# Patient Record
Sex: Female | Born: 1975 | Race: White | Hispanic: No | Smoking: Current every day smoker
Health system: Southern US, Community
[De-identification: ages and names within clinical notes are randomized; demographics above are authoritative.]

## PROBLEM LIST (undated history)

## (undated) DIAGNOSIS — R112 Nausea with vomiting, unspecified: Secondary | ICD-10-CM

## (undated) DIAGNOSIS — J45909 Unspecified asthma, uncomplicated: Secondary | ICD-10-CM

## (undated) DIAGNOSIS — F988 Other specified behavioral and emotional disorders with onset usually occurring in childhood and adolescence: Secondary | ICD-10-CM

## (undated) DIAGNOSIS — F32A Depression, unspecified: Secondary | ICD-10-CM

## (undated) DIAGNOSIS — F329 Major depressive disorder, single episode, unspecified: Secondary | ICD-10-CM

## (undated) DIAGNOSIS — Z9889 Other specified postprocedural states: Secondary | ICD-10-CM

## (undated) DIAGNOSIS — N63 Unspecified lump in unspecified breast: Secondary | ICD-10-CM

## (undated) DIAGNOSIS — R51 Headache: Secondary | ICD-10-CM

## (undated) HISTORY — PX: TONSILLECTOMY AND ADENOIDECTOMY: SHX28

## (undated) HISTORY — PX: LEEP: SHX91

## (undated) HISTORY — PX: CYST EXCISION: SHX5701

## (undated) HISTORY — PX: ENDOMETRIAL ABLATION: SHX621

## (undated) HISTORY — PX: TUBAL LIGATION: SHX77

## (undated) HISTORY — DX: Unspecified asthma, uncomplicated: J45.909

---

## 2011-06-10 ENCOUNTER — Emergency Department
Admission: EM | Admit: 2011-06-10 | Discharge: 2011-06-10 | Disposition: A | Discharge status: Home / Self Care | Payer: 59 | Source: Home / Self Care | Attending: Family Medicine | Admitting: Family Medicine

## 2011-06-10 ENCOUNTER — Encounter: Payer: Self-pay | Admitting: Emergency Medicine

## 2011-06-10 DIAGNOSIS — J0100 Acute maxillary sinusitis, unspecified: Secondary | ICD-10-CM

## 2011-06-10 DIAGNOSIS — J069 Acute upper respiratory infection, unspecified: Secondary | ICD-10-CM

## 2011-06-10 DIAGNOSIS — J45909 Unspecified asthma, uncomplicated: Secondary | ICD-10-CM

## 2011-06-10 DIAGNOSIS — J4540 Moderate persistent asthma, uncomplicated: Secondary | ICD-10-CM

## 2011-06-10 MED ORDER — AMOXICILLIN 875 MG PO TABS: 875.0000 mg | ORAL_TABLET | Freq: Two times a day (BID) | ORAL | Status: AC

## 2011-06-10 MED ORDER — BUDESONIDE 90 MCG/ACT IN AEPB: 2.0000 | INHALATION_SPRAY | Freq: Two times a day (BID) | RESPIRATORY_TRACT | Status: DC

## 2011-06-10 MED ORDER — GUAIFENESIN-CODEINE 100-10 MG/5ML PO SYRP: 10.0000 mL | ORAL_SOLUTION | Freq: Every day | ORAL | Status: AC

## 2011-06-10 MED ORDER — PREDNISONE 20 MG PO TABS: ORAL_TABLET | ORAL | Status: DC

## 2011-06-10 NOTE — Discharge Instructions (Signed)
Take Mucinex D (guaifenesin with decongestant) twice daily for congestion.  Increase fluid intake, rest. May use Afrin nasal spray (or generic oxymetazoline) twice daily for about 5 days.  Also recommend using saline nasal spray several times daily and saline nasal irrigation (AYR is a common brand) Continue albuterol inhaler as needed. Stop all antihistamines for now, and other non-prescription cough/cold preparations. Follow-up with family doctor in about one month to discuss an asthma plan of treatment

## 2011-06-10 NOTE — ED Provider Notes (Signed)
History     CSN: [id number]  Arrival date & time 06/10/11  1949   First MD Initiated Contact with Patient 06/10/11 2002      Chief Complaint  Patient presents with  . Nasal Congestion      HPI Comments: Patient complains of approximately 7 day history of gradually progressive URI symptoms beginning with a mild sore throat (now improved), followed by progressive nasal congestion.  A cough started about 5 days ago.  Complains of fatigue and initial myalgias.  Cough is now worse at night and generally non-productive during the day.  There has been no pleuritic pain but she has developed persistent wheezing and shortness of breath with activity.  Last night she noted a fever of 101. She has asthma and normally uses her albuterol inhaler about 3 times/week.  Over the past several days she has had to use it multiple times daily.  When well she does not generally have night cough.  She had a respiratory infection requiring prednisone last month.  She is not presently on a controller medication although she has used Advair in the past.  She also does not have an asthma treatment plan.  She has a history of seasonal allergies, and she has had pneumonia in the past, about 11 years ago.   The history is provided by the patient.    Past Medical History  Diagnosis Date  . Asthma     History reviewed. No pertinent past surgical history.  No pertinent family history.  History  Substance Use Topics  . Smoking status: Never Smoker   . Smokeless tobacco: Not on file  . Alcohol Use: Yes    OB History    Grav Para Term Preterm Abortions TAB SAB Ect Mult Living                  Review of Systems + sore throat + cough No pleuritic pain + wheezing + nasal congestion + post-nasal drainage + sinus pain/pressure No itchy/red eyes ? Earache; right ear feels clogged for 3 days No hemoptysis + SOB with activity + fever/chills to 101 last night No nausea No vomiting No abdominal pain No  diarrhea No urinary symptoms No skin rashes + fatigue + myalgias No headache Used OTC meds without relief  Allergies  Vicodin  Home Medications   Current Outpatient Rx  Name Route Sig Dispense Refill  . ALBUTEROL SULFATE (2.5 MG/3ML) 0.083% IN NEBU Nebulization Take 2.5 mg by nebulization every 6 (six) hours as needed.    . BUPROPION HCL 100 MG PO TABS Oral Take 100 mg by mouth 2 (two) times daily.    Marland Kitchen ESCITALOPRAM OXALATE 10 MG PO TABS Oral Take 10 mg by mouth daily.    . AMOXICILLIN 875 MG PO TABS Oral Take 1 tablet (875 mg total) by mouth 2 (two) times daily. 20 tablet 0  . BUDESONIDE 90 MCG/ACT IN AEPB Inhalation Inhale 2 puffs into the lungs 2 (two) times daily. 1 Inhaler 1  . GUAIFENESIN-CODEINE 100-10 MG/5ML PO SYRP Oral Take 10 mLs by mouth at bedtime. for cough 120 mL 0  . PREDNISONE 20 MG PO TABS  Take one tab by mouth twice daily for 5 days 10 tablet 0    BP 140/87  Pulse 79  Temp(Src) 98.9 F (37.2 C) (Oral)  Resp 18  Ht 5\' 6"  (1.676 m)  Wt 177 lb (80.287 kg)  BMI 28.57 kg/m2  SpO2 93%  Physical Exam Nursing notes and Vital Signs  reviewed. Appearance:  Patient appears healthy, stated age, and in no acute distress Eyes:  Pupils are equal, round, and reactive to light and accomodation.  Extraocular movement is intact.  Conjunctivae are not inflamed  Ears:  Canals normal.  Tympanic membranes normal.  Nose:  Moderately congested turbinates.  Maxillary sinus tenderness is present.  Pharynx:  Normal Neck:  Supple.  Slightly tender shotty posterior nodes are palpated bilaterally  Lungs:  Bibasilar posterior wheezes are present.  Breath sounds are equal.  No respiratory distress Chest:  Mild tenderness to palpation over the mid-sternum.  Heart:  Regular rate and rhythm without murmurs, rubs, or gallops.  Abdomen:  Nontender without masses or hepatosplenomegaly.  Bowel sounds are present.  No CVA or flank tenderness.  Extremities:  No edema.  No calf tenderness Skin:   No rash present.   ED Course  Procedures  none      1. Acute upper respiratory infections of unspecified site   2. Moderate persistent asthma; not adequately controlled, exacerbated by present URI  3. Acute maxillary sinusitis       MDM   Begin amoxicillin for 10 days.  Begin prednisone burst.  Begin controller medication with Pulmicort.  Rx for cough suppressant at bedtime (patient notes that she has no adverse reactions to codeine). Take Mucinex D (guaifenesin with decongestant) twice daily for congestion.  Increase fluid intake, rest. May use Afrin nasal spray (or generic oxymetazoline) twice daily for about 5 days.  Also recommend using saline nasal spray several times daily and saline nasal irrigation (AYR is a common brand) Continue albuterol inhaler as needed. Stop all antihistamines for now, and other non-prescription cough/cold preparations. Followup with Family Doctor if not improved in one week.  Return for worsening symptoms. Continue the Pulmicort and follow-up with family doctor in about one month to discuss an asthma plan of treatment         Lattie Haw, MD 06/11/11 [phone]

## 2011-06-10 NOTE — ED Notes (Signed)
Congestion, thick yellow mucus, wheezing x 1 week

## 2012-01-06 ENCOUNTER — Ambulatory Visit (INDEPENDENT_AMBULATORY_CARE_PROVIDER_SITE_OTHER): Payer: 59

## 2012-01-06 ENCOUNTER — Ambulatory Visit (INDEPENDENT_AMBULATORY_CARE_PROVIDER_SITE_OTHER): Payer: 59 | Admitting: Family Medicine

## 2012-01-06 ENCOUNTER — Encounter: Payer: Self-pay | Admitting: Family Medicine

## 2012-01-06 VITALS — BP 148/101 | HR 92 | Temp 97.8°F | Wt 168.0 lb

## 2012-01-06 DIAGNOSIS — R062 Wheezing: Secondary | ICD-10-CM

## 2012-01-06 DIAGNOSIS — R0602 Shortness of breath: Secondary | ICD-10-CM

## 2012-01-06 DIAGNOSIS — R05 Cough: Secondary | ICD-10-CM

## 2012-01-06 DIAGNOSIS — J45901 Unspecified asthma with (acute) exacerbation: Secondary | ICD-10-CM

## 2012-01-06 MED ORDER — IPRATROPIUM BROMIDE 0.02 % IN SOLN
0.5000 mg | Freq: Once | RESPIRATORY_TRACT | Status: AC
  Administered 2012-01-06: 0.5 mg via RESPIRATORY_TRACT

## 2012-01-06 MED ORDER — SODIUM CHLORIDE 0.9 % IV SOLN: 40.0000 mg | Freq: Once | INTRAVENOUS | Status: DC

## 2012-01-06 MED ORDER — METHYLPREDNISOLONE SODIUM SUCC 40 MG IJ SOLR: 80.0000 mg | Freq: Once | INTRAMUSCULAR | Status: DC

## 2012-01-06 MED ORDER — ALBUTEROL SULFATE (2.5 MG/3ML) 0.083% IN NEBU
2.5000 mg | INHALATION_SOLUTION | Freq: Once | RESPIRATORY_TRACT | Status: AC
  Administered 2012-01-06: 2.5 mg via RESPIRATORY_TRACT

## 2012-01-06 MED ORDER — PREDNISONE 20 MG PO TABS: ORAL_TABLET | ORAL | Status: AC

## 2012-01-06 MED ORDER — DOXYCYCLINE HYCLATE 100 MG PO TABS: ORAL_TABLET | ORAL | Status: AC

## 2012-01-06 NOTE — Patient Instructions (Signed)
Start prednisone and doxycycline as soon as possible. Use the albuterol inhaler 2- 4 puffs every 4 hours for the next 48 hours and then every 6 hours only as needed. Please return sometime next week for reevaluation

## 2012-01-06 NOTE — Progress Notes (Signed)
CC: Janet Harrison is a 36 y.o. female is here for Establish Care and Respiratory Distress   Subjective: HPI:  Very pleasant 36 year old presents to establish care.  Over the past 48 hours she has had worsening shortness of breath, persistent cough, and tightness of the chest. Symptoms seem to be worse in the evenings and when taking showers. Her cough is nonproductive, has not had any blood in her sputum. She describes the above symptoms as moderate to severe in severity. Symptoms i improved drastically to albuterol that she has at home. Nothing seems to make better or worse otherwise. She doesn't she has a history of asthma as a child but is not on any controller medication, she rarely uses her albuterol other than this recent duration. She denies fevers, chills, confusion, headaches, motor sensory disturbances, back pain, abdominal pain, nausea, fatigue, rashes, joint or muscle pain, unilateral limb swelling, nor history of blood clots.   Review Of Systems Outlined In HPI  Past Medical History  Diagnosis Date  . Asthma   . Hypertension      Family History  Problem Relation Age of Onset  . Breast cancer      colon cancer     History  Substance Use Topics  . Smoking status: Never Smoker   . Smokeless tobacco: Not on file  . Alcohol Use: Yes     Objective: Filed Vitals:   01/06/12 0934  BP: 148/101  Pulse: 92  Temp: 97.8 F (36.6 C)    General: Alert and Oriented, No Acute Distress HEENT: Pupils equal, round, reactive to light. Conjunctivae clear.  External ears unremarkable, canals clear with intact TMs with appropriate landmarks.  Middle ear appears open without effusion. Pink inferior turbinates.  Moist mucous membranes, pharynx without inflammation nor lesions.  Neck supple without palpable lymphadenopathy nor abnormal masses. Lungs: Moderate diffuse end expiratory wheezing with mild diffuse rhonchi, no signs of consolidation, frequent repetitive coughing Cardiac: Regular  rate and rhythm. Normal S1/S2.  No murmurs, rubs, nor gallops.   Abdomen: Soft nontender to palpation Extremities: No peripheral edema.  Strong peripheral pulses.  Mental Status: No depression, anxiety, nor agitation. Skin: Warm and dry.  Assessment & Plan: Hosanna was seen today for establish care and respiratory distress.  Diagnoses and associated orders for this visit:  Cough - DG Chest 2 View; Future - methylPREDNISolone sodium succinate (SOLU-MEDROL) 40 mg in sodium chloride 0.9 % 50 mL IVPB; Inject 40 mg into the muscle once. - methylPREDNISolone sodium succinate (SOLU-MEDROL) 40 mg in sodium chloride 0.9 % 50 mL IVPB; Inject 40 mg into the vein once.  Shortness of breath - DG Chest 2 View; Future  Asthma exacerbation - predniSONE (DELTASONE) 20 MG tablet; Three tabs daily days 1-3, two tabs daily days 4-6, one tab daily days 7-9, half tab daily days 10-13. - doxycycline (VIBRA-TABS) 100 MG tablet; One by mouth twice a day for ten days. - albuterol (PROVENTIL) (2.5 MG/3ML) 0.083% nebulizer solution 2.5 mg; Take 3 mLs (2.5 mg total) by nebulization once. - ipratropium (ATROVENT) nebulizer solution 0.5 mg; Take 2.5 mLs (0.5 mg total) by nebulization once.  Walking pulse oximetry did not fall, chest x-ray was obtained personally interpreted by myself showing bronchial wall thickening, hyperinflation, no signs of consolidation nor pulmonary edema. She received DuoNeb nebulizer which greatly improved her cough frequency, and she sounded much more clear on lung exam. Shot of Solu-Medrol given, she'll start prednisone taper, and doxycycline for any bacterial coverage. I've asked her to return  next week for reevaluation and discuss management of asthma.    Return in about 1 week (around 01/13/2012).

## 2012-01-11 ENCOUNTER — Encounter: Payer: Self-pay | Admitting: Family Medicine

## 2012-01-11 ENCOUNTER — Ambulatory Visit (INDEPENDENT_AMBULATORY_CARE_PROVIDER_SITE_OTHER): Payer: 59 | Admitting: Family Medicine

## 2012-01-11 VITALS — BP 140/93 | HR 84 | Wt 169.0 lb

## 2012-01-11 DIAGNOSIS — J3089 Other allergic rhinitis: Secondary | ICD-10-CM

## 2012-01-11 DIAGNOSIS — Z87410 Personal history of cervical dysplasia: Secondary | ICD-10-CM | POA: Insufficient documentation

## 2012-01-11 DIAGNOSIS — F419 Anxiety disorder, unspecified: Secondary | ICD-10-CM | POA: Insufficient documentation

## 2012-01-11 DIAGNOSIS — F329 Major depressive disorder, single episode, unspecified: Secondary | ICD-10-CM

## 2012-01-11 DIAGNOSIS — IMO0002 Reserved for concepts with insufficient information to code with codable children: Secondary | ICD-10-CM | POA: Insufficient documentation

## 2012-01-11 DIAGNOSIS — J45909 Unspecified asthma, uncomplicated: Secondary | ICD-10-CM

## 2012-01-11 DIAGNOSIS — F411 Generalized anxiety disorder: Secondary | ICD-10-CM

## 2012-01-11 DIAGNOSIS — J3081 Allergic rhinitis due to animal (cat) (dog) hair and dander: Secondary | ICD-10-CM

## 2012-01-11 MED ORDER — ESCITALOPRAM OXALATE 20 MG PO TABS: 10.0000 mg | ORAL_TABLET | Freq: Every day | ORAL | Status: DC

## 2012-01-11 MED ORDER — BUDESONIDE 90 MCG/ACT IN AEPB: 2.0000 | INHALATION_SPRAY | Freq: Two times a day (BID) | RESPIRATORY_TRACT | Status: DC

## 2012-01-11 MED ORDER — BUPROPION HCL ER (XL) 300 MG PO TB24: 300.0000 mg | ORAL_TABLET | Freq: Every day | ORAL | Status: DC

## 2012-01-11 MED ORDER — ESCITALOPRAM OXALATE 20 MG PO TABS: 20.0000 mg | ORAL_TABLET | Freq: Every day | ORAL | Status: DC
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
## 2012-02-10 ENCOUNTER — Other Ambulatory Visit: Payer: Self-pay | Admitting: *Deleted

## 2012-02-10 MED ORDER — ALBUTEROL SULFATE (2.5 MG/3ML) 0.083% IN NEBU: 2.5000 mg | INHALATION_SOLUTION | Freq: Four times a day (QID) | RESPIRATORY_TRACT | Status: DC | PRN

## 2012-02-11 ENCOUNTER — Encounter: Payer: 59 | Admitting: Family Medicine

## 2012-02-12 ENCOUNTER — Encounter: Payer: 59 | Admitting: Family Medicine

## 2012-02-29 ENCOUNTER — Encounter: Payer: 59 | Admitting: Family Medicine

## 2012-03-08 ENCOUNTER — Ambulatory Visit (INDEPENDENT_AMBULATORY_CARE_PROVIDER_SITE_OTHER): Payer: 59 | Admitting: Family Medicine

## 2012-03-08 ENCOUNTER — Other Ambulatory Visit (HOSPITAL_COMMUNITY)
Admission: RE | Admit: 2012-03-08 | Discharge: 2012-03-08 | Disposition: A | Discharge status: Home / Self Care | Payer: 59 | Source: Ambulatory Visit | Attending: Family Medicine | Admitting: Family Medicine

## 2012-03-08 ENCOUNTER — Encounter: Payer: Self-pay | Admitting: Family Medicine

## 2012-03-08 VITALS — BP 133/84 | HR 79 | Wt 170.0 lb

## 2012-03-08 DIAGNOSIS — Z131 Encounter for screening for diabetes mellitus: Secondary | ICD-10-CM

## 2012-03-08 DIAGNOSIS — Z87410 Personal history of cervical dysplasia: Secondary | ICD-10-CM

## 2012-03-08 DIAGNOSIS — Z23 Encounter for immunization: Secondary | ICD-10-CM

## 2012-03-08 DIAGNOSIS — Z1151 Encounter for screening for human papillomavirus (HPV): Secondary | ICD-10-CM | POA: Insufficient documentation

## 2012-03-08 DIAGNOSIS — Z13220 Encounter for screening for lipoid disorders: Secondary | ICD-10-CM

## 2012-03-08 DIAGNOSIS — Z01419 Encounter for gynecological examination (general) (routine) without abnormal findings: Secondary | ICD-10-CM | POA: Insufficient documentation

## 2012-03-08 DIAGNOSIS — J45909 Unspecified asthma, uncomplicated: Secondary | ICD-10-CM

## 2012-03-08 DIAGNOSIS — Z124 Encounter for screening for malignant neoplasm of cervix: Secondary | ICD-10-CM

## 2012-03-08 DIAGNOSIS — Z0000 Encounter for general adult medical examination without abnormal findings: Secondary | ICD-10-CM

## 2012-03-08 LAB — BASIC METABOLIC PANEL WITH GFR
CO2: 22 mEq/L (ref 19–32)
Calcium: 8.7 mg/dL (ref 8.4–10.5)
GFR, Est African American: >89 mL/min
Potassium: 4.1 mEq/L (ref 3.5–5.3)
Sodium: 140 mEq/L (ref 135–145)

## 2012-03-08 LAB — LIPID PANEL
HDL: 46 mg/dL (ref >39)
LDL Cholesterol: 117 mg/dL — ABNORMAL HIGH (ref 0–99)
Total CHOL/HDL Ratio: 3.8 Ratio

## 2012-03-08 MED ORDER — ALBUTEROL SULFATE HFA 108 (90 BASE) MCG/ACT IN AERS: 2.0000 | INHALATION_SPRAY | Freq: Four times a day (QID) | RESPIRATORY_TRACT | Status: DC | PRN

## 2012-03-08 MED ORDER — BUDESONIDE 90 MCG/ACT IN AEPB: 2.0000 | INHALATION_SPRAY | Freq: Two times a day (BID) | RESPIRATORY_TRACT | Status: DC

## 2012-03-08 NOTE — Progress Notes (Signed)
CC: Janet Harrison is a 37 y.o. female is here for Annual Exam and Gynecologic Exam   Colonoscopy: Aunt had colon cancer at 75, will start around 37 years of age. Papsmear: Had a LEEP within this year, came back nomal, due for re-screening. Mammogram: Grandmother had breast cancer in early 41s.   Influenza Vaccine: will receive today Pneumovax: Asthmatic, will receive today Td/Tdap: Has been greater than 10 years for Td, will receive Tdap today  Subjective: HPI:  Patient presents with no acute complaints other than losing Pulmicort inhaler. Reports great improvement in lack of need for albuterol while on Pulmicort.  No tobacco use, rare alcohol use, no recreational drug use. No formal exercise program. Tries to watch her she eats but admits to room for improvement  Review of Systems - General ROS: negative for - chills, fever, night sweats, weight gain or weight loss Ophthalmic ROS: negative for - decreased vision Psychological ROS: negative for - anxiety or depression ENT ROS: negative for - hearing change, nasal congestion, tinnitus or allergies Hematological and Lymphatic ROS: negative for - bleeding problems, bruising or swollen lymph nodes Breast ROS: negative Respiratory ROS: no cough, shortness of breath, or wheezing Cardiovascular ROS: no chest pain or dyspnea on exertion Gastrointestinal ROS: no abdominal pain, change in bowel habits, or black or bloody stools Genito-Urinary ROS: negative for - genital discharge, genital ulcers, incontinence or abnormal bleeding from genitals Musculoskeletal ROS: negative for - joint pain or muscle pain Neurological ROS: negative for - headaches or memory loss Dermatological ROS: negative for lumps, mole changes, rash and skin lesion changes  Past Medical History  Diagnosis Date  . Asthma   . Hypertension   . Asthma 01/11/2012     Family History  Problem Relation Age of Onset  . Breast cancer      colon cancer     History  Substance  Use Topics  . Smoking status: Never Smoker   . Smokeless tobacco: Not on file  . Alcohol Use: Yes     Objective: Filed Vitals:   03/08/12 0847  BP: 133/84  Pulse: 79    General: No Acute Distress HEENT: Atraumatic, normocephalic, conjunctivae normal without scleral icterus.  No nasal discharge, hearing grossly intact, TMs with good landmarks bilaterally with no middle ear abnormalities, posterior pharynx clear without oral lesions. Neck: Supple, trachea midline, no cervical nor supraclavicular adenopathy. Pulmonary: Clear to auscultation bilaterally without wheezing, rhonchi, nor rales. Cardiac: Regular rate and rhythm.  No murmurs, rubs, nor gallops. No peripheral edema.  2+ peripheral pulses bilaterally. Abdomen: Bowel sounds normal.  No masses.  Non-tender without rebound.  Negative Murphy's sign. GU: Labia majora & minora without lesions.  Distal urethra unremarkable.  Vaginal wall integrity preserved without mucosal lesions.  Cervix non-tender and without gross lesions nor discharge.  Transition zone appears to be proximal to the external os. MSK: Grossly intact, no signs of weakness.  Full strength throughout upper and lower extremities.  Full ROM in upper and lower extremities.  No midline spinal tenderness. Neuro: Gait unremarkable, CN II-XII grossly intact.  C5-C6 Reflex 2/4 Bilaterally, L4 Reflex 2/4 Bilaterally.  Cerebellar function intact. Skin: No rashes. Psych: Alert and oriented to person/place/time.  Thought process normal. No anxiety/depression.  Assessment & Plan: Janet Harrison was seen today for annual exam and gynecologic exam.  Diagnoses and associated orders for this visit:  History of cervical dysplasia - Cytology - PAP  Lipid screening - Lipid panel  Diabetes mellitus screening - BASIC METABOLIC PANEL WITH  GFR  Asthma - Budesonide (PULMICORT FLEXHALER) 90 MCG/ACT inhaler; Inhale 2 puffs into the lungs 2 (two) times daily.  Asthma, chronic - Budesonide  (PULMICORT FLEXHALER) 90 MCG/ACT inhaler; Inhale 2 puffs into the lungs 2 (two) times daily. - Pneumococcal polysaccharide vaccine 23-valent greater than or equal to 2yo subcutaneous/IM - albuterol (PROVENTIL HFA;VENTOLIN HFA) 108 (90 BASE) MCG/ACT inhaler; Inhale 2 puffs into the lungs every 6 (six) hours as needed for wheezing.  Annual physical exam - Cytology - PAP  Other Orders - Tdap vaccine greater than or equal to 7yo IM - Flu vaccine greater than or equal to 3yo with preservative IM    Pap smear was obtained, discussed with the patient that I'm not sure if I was able to sample the transition zone/SQ junction due to anatomical changes likely due to her LEEP. Past has been sent to pathology. She's due for diabetes screening and routine lipid panel. She received immunizations above and is now up-to-date. We discussed healthy lifestyle options and interventions with diet and exercise to promote general well-being and overall health. A handout was given to the patient further emphasizing these topics.  Return in about 3 months (around 06/05/2012).

## 2012-03-08 NOTE — Patient Instructions (Signed)
Dr. Nashawn Hillock's General Advice Following Your Complete Physical Exam  The Benefits of Regular Exercise: Unless you suffer from an uncontrolled cardiovascular condition, studies strongly suggest that regular exercise and physical activity will add to both the quality and length of your life.  The World Health Organization recommends 150 minutes of moderate intensity aerobic activity every week.  This is best split over 3-4 days a week, and can be as simple as a brisk walk for just over 35 minutes "most days of the week".  This type of exercise has been shown to lower LDL-Cholesterol, lower average blood sugars, lower blood pressure, lower cardiovascular disease risk, improve memory, and increase one's overall sense of wellbeing.  The addition of anaerobic (or "strength training") exercises offers additional benefits including but not limited to increased metabolism, prevention of osteoporosis, and improved overall cholesterol levels.  How Can I Strive For A Low-Fat Diet?: Current guidelines recommend that 25-35 percent of your daily energy (food) intake should come from fats.  One might ask how can this be achieved without having to dissect each meal on a daily basis?  Switch to skim or 1% milk instead of whole milk.  Focus on lean meats such as ground turkey, fresh fish, baked chicken, and lean cuts of beef as your source of dietary protein.  Consume less than 300mg/day of dietary cholesterol.  Limit trans fatty acid consumption primarily by limiting synthetic trans fats such as partially hydrogenated oils (Ex: fried fast foods).  Focus efforts on reducing your intake of "solid" fats (Ex: Butter).  Substitute olive or vegetable oil for solid fats where possible.  Moderation of Salt Intake: Provided you don't carry a diagnosis of congestive heart failure nor renal failure, I recommend a daily allowance of no more than 2300 mg of salt (sodium).  Keeping under this daily goal is associated with a  decreased risk of cardiovascular events, creeping above it can lead to elevated blood pressures and increases your risk of cardiovascular events.  Milligrams (mg) of salt is listed on all nutrition labels, and your daily intake can add up faster than you think.  Most canned and frozen dinners can pack in over half your daily salt allowance in one meal.    Lifestyle Health Risks: Certain lifestyle choices carry specific health risks.  As you may already know, tobacco use has been associated with increasing one's risk of cardiovascular disease, pulmonary disease, numerous cancers, among many other issues.  What you may not know is that there are medications and nicotine replacement strategies that can more than double your chances of successfully quitting.  I would be thrilled to help manage your quitting strategy if you currently use tobacco products.  When it comes to alcohol use, I've yet to find an "ideal" daily allowance.  Provided an individual does not have a medical condition that is exacerbated by alcohol consumption, general guidelines determine "safe drinking" as no more than two standard drinks for a man or no more than one standard drink for a female per day.  However, much debate still exists on whether any amount of alcohol consumption is technically "safe".  My general advice, keep alcohol consumption to a minimum for general health promotion.  If you or others believe that alcohol, tobacco, or recreational drug use is interfering with your life, I would be happy to provide confidential counseling regarding treatment options.  General "Over The Counter" Nutrition Advice: Postmenopausal women should aim for a daily calcium intake of 1200 mg, however a significant   portion of this might already be provided by diets including milk, yogurt, cheese, and other dairy products.  Vitamin D has been shown to help preserve bone density, prevent fatigue, and has even been shown to help reduce falls in the  elderly.  Ensuring a daily intake of 800 Units of Vitamin D is a good place to start to enjoy the above benefits, we can easily check your Vitamin D level to see if you'd potentially benefit from supplementation beyond 800 Units a day.  Folic Acid intake should be of particular concern to women of childbearing age.  Daily consumption of 400-800 mcg of Folic Acid is recommended to minimize the chance of spinal cord defects in a fetus should pregnancy occur.    For many adults, accidents still remain one of the most common culprits when it comes to cause of death.  Some of the simplest but most effective preventitive habits you can adopt include regular seatbelt use, proper helmet use, securing firearms, and regularly testing your smoke and carbon monoxide detectors.  Janet Harrison B. Janet Ravenscroft DO [facility] [address], [address] [place], [place] [address] Phone: [phone]  

## 2012-03-16 ENCOUNTER — Telehealth: Payer: Self-pay | Admitting: Family Medicine

## 2012-03-16 DIAGNOSIS — Z87898 Personal history of other specified conditions: Secondary | ICD-10-CM

## 2012-03-16 NOTE — Telephone Encounter (Signed)
Pt.notified

## 2012-03-16 NOTE — Telephone Encounter (Signed)
Called and vm has not been set up, unable to leave message

## 2012-03-16 NOTE — Telephone Encounter (Signed)
Janet Harrison, Will you please let Janet Harrison know that there was no abnormality seen on her pap smear nor evidence of high risk HPV.  As I mentioned to her during her visit, I had trouble sampling the cells within the cervix and the pap only contained cells from the outer surface.  I'd like her to visit with our GYN colleagues to see if she needs to repeat the sample given her history of abnormal paps in the past.  I have placed a referral.

## 2012-04-06 ENCOUNTER — Encounter: Payer: 59 | Admitting: Obstetrics & Gynecology

## 2012-04-14 ENCOUNTER — Encounter: Payer: 59 | Admitting: Obstetrics & Gynecology

## 2012-04-14 DIAGNOSIS — R87619 Unspecified abnormal cytological findings in specimens from cervix uteri: Secondary | ICD-10-CM

## 2012-04-26 ENCOUNTER — Ambulatory Visit (INDEPENDENT_AMBULATORY_CARE_PROVIDER_SITE_OTHER): Payer: 59 | Admitting: Family Medicine

## 2012-04-26 ENCOUNTER — Encounter: Payer: Self-pay | Admitting: Family Medicine

## 2012-04-26 VITALS — BP 139/93 | HR 78 | Wt 165.0 lb

## 2012-04-26 DIAGNOSIS — N63 Unspecified lump in unspecified breast: Secondary | ICD-10-CM

## 2012-04-26 DIAGNOSIS — N6310 Unspecified lump in the right breast, unspecified quadrant: Secondary | ICD-10-CM

## 2012-04-26 NOTE — Addendum Note (Signed)
Addended by: Laren Boom on: 04/26/2012 09:26 AM   Modules accepted: Orders

## 2012-04-26 NOTE — Progress Notes (Signed)
CC: Janet Harrison is a 37 y.o. female is here for lump on breast   Subjective: HPI:  Patient complains of a lump on her right breast. She noticed it Sunday night. She noticed it while changing close, since noticing it has been tender to the touch, she believes it's been shrinking ever since she noticed it.  No interventions as of yet. Localized to the right breast, lateral surface.   She reports breast tenderness when menstruating years ago however no discomfort up until Sunday. She denies recent or remote trauma to the breasts. She denies swollen lymph nodes, unintentional weight loss, nipple discharge, architectural changes of the breast. Reports a history of what sounds like cystic changes in her mother's breasts and breast cancer in her maternal grandmother.   Review Of Systems Outlined In HPI  Past Medical History  Diagnosis Date  . Asthma   . Hypertension   . Asthma 01/11/2012     Family History  Problem Relation Age of Onset  . Breast cancer      colon cancer     History  Substance Use Topics  . Smoking status: Never Smoker   . Smokeless tobacco: Not on file  . Alcohol Use: Yes     Objective: Filed Vitals:   04/26/12 0825  BP: 139/93  Pulse: 78    Vital signs reviewed. General: Alert and Oriented, No Acute Distress HEENT: Pupils equal, round, reactive to light. Conjunctivae clear.  External ears unremarkable.  Moist mucous membranes. Lungs: Clear and comfortable work of breathing, speaking in full sentences without accessory muscle use. Cardiac: Regular rate and rhythm.  Neuro: CN II-XII grossly intact, gait normal. Extremities: No peripheral edema.  Strong peripheral pulses.  Breasts: No suspicious architectural changes in either breast while sitting upright. No nipple retractions or gross abnormalities bilaterally. No nipple discharge. When examining the right breast there is no supraclavicular nor axillary lymphadenopathy or palpable masses. Approximately 3  fingerbreadths from the nipple in the 7:00 position there is a firm and tender approximately 3 mm nodule without overlying skin changes Mental Status: No depression, anxiety, nor agitation. Logical though process. Skin: Warm and dry.  Assessment & Plan: Janet Harrison was seen today for lump on breast.  Diagnoses and associated orders for this visit:  Left breast lump - Cancel: MM Digital Diagnostic Unilat R; Future - MM Digital Diagnostic Unilat R; Future  Other Orders - Cancel: MM Digital Diagnostic Unilat L; Future    Discussed with patient the need for better characterization of the lesion/nodule with diagnostic mammogram and possibly ultrasound. We'll place order for this today and keep patient updated as results become available  Return if symptoms worsen or fail to improve.

## 2012-05-04 ENCOUNTER — Telehealth: Payer: Self-pay | Admitting: *Deleted

## 2012-05-04 DIAGNOSIS — N6310 Unspecified lump in the right breast, unspecified quadrant: Secondary | ICD-10-CM

## 2012-05-04 NOTE — Telephone Encounter (Signed)
Order was faxed.

## 2012-05-04 NOTE — Telephone Encounter (Signed)
Imaging called and states they need an order for diagnostic and for it to say U/S if needed faxed to [phone]

## 2012-05-04 NOTE — Telephone Encounter (Signed)
Janet Harrison, Signed form and placed in your inbox.

## 2012-05-05 ENCOUNTER — Telehealth: Payer: Self-pay | Admitting: *Deleted

## 2012-05-05 ENCOUNTER — Encounter: Payer: 59 | Admitting: Obstetrics & Gynecology

## 2012-05-05 DIAGNOSIS — N6310 Unspecified lump in the right breast, unspecified quadrant: Secondary | ICD-10-CM

## 2012-05-05 DIAGNOSIS — R87619 Unspecified abnormal cytological findings in specimens from cervix uteri: Secondary | ICD-10-CM

## 2012-05-05 NOTE — Telephone Encounter (Signed)
Had to re-enter the diagnostic mammo for the breast center in [place] rather than [facility] [place]. Barry Dienes, LPN

## 2012-05-06 ENCOUNTER — Other Ambulatory Visit: Payer: Self-pay | Admitting: Family Medicine

## 2012-05-06 ENCOUNTER — Encounter: Payer: Self-pay | Admitting: Family Medicine

## 2012-05-06 DIAGNOSIS — N6310 Unspecified lump in the right breast, unspecified quadrant: Secondary | ICD-10-CM

## 2012-05-09 ENCOUNTER — Encounter: Payer: Self-pay | Admitting: *Deleted

## 2012-05-16 ENCOUNTER — Encounter: Payer: Self-pay | Admitting: Family Medicine

## 2012-05-19 ENCOUNTER — Telehealth: Payer: Self-pay | Admitting: Family Medicine

## 2012-05-19 DIAGNOSIS — N6310 Unspecified lump in the right breast, unspecified quadrant: Secondary | ICD-10-CM

## 2012-05-19 NOTE — Telephone Encounter (Signed)
Patient notified of biopsy results. She's in agreement with Q6 month mammo follow up.

## 2012-06-03 ENCOUNTER — Ambulatory Visit: Payer: 59 | Admitting: Family Medicine

## 2012-06-06 ENCOUNTER — Ambulatory Visit: Payer: 59 | Admitting: Family Medicine

## 2012-06-06 DIAGNOSIS — Z0289 Encounter for other administrative examinations: Secondary | ICD-10-CM

## 2012-06-10 ENCOUNTER — Encounter: Payer: Self-pay | Admitting: Family Medicine

## 2012-06-15 ENCOUNTER — Encounter: Payer: Self-pay | Admitting: *Deleted

## 2012-06-15 ENCOUNTER — Emergency Department (INDEPENDENT_AMBULATORY_CARE_PROVIDER_SITE_OTHER)
Admission: EM | Admit: 2012-06-15 | Discharge: 2012-06-15 | Disposition: A | Discharge status: Home / Self Care | Payer: 59 | Source: Home / Self Care | Attending: Emergency Medicine | Admitting: Emergency Medicine

## 2012-06-15 DIAGNOSIS — J0100 Acute maxillary sinusitis, unspecified: Secondary | ICD-10-CM

## 2012-06-15 MED ORDER — METHYLPREDNISOLONE ACETATE 80 MG/ML IJ SUSP
80.0000 mg | Freq: Once | INTRAMUSCULAR | Status: AC
  Administered 2012-06-15: 80 mg via INTRAMUSCULAR

## 2012-06-15 MED ORDER — AMOXICILLIN-POT CLAVULANATE 875-125 MG PO TABS: 1.0000 | ORAL_TABLET | Freq: Two times a day (BID) | ORAL | Status: DC

## 2012-06-15 NOTE — ED Provider Notes (Addendum)
History     CSN: [id number]  Arrival date & time 06/15/12  1827   None     Chief Complaint  Patient presents with  . Nasal Congestion    (Consider location/radiation/quality/duration/timing/severity/associated sxs/prior treatment) HPI Janet Harrison is a 37 y.o. female who complains of onset of cold symptoms for 2 weeks.  The symptoms are constant and mild-moderate in severity.  She has asthma and has been using her inhaler frequently with some wheezing and shortness of breath.  Her children are also been sick recently. No sore throat + cough No pleuritic pain + wheezing + nasal congestion + post-nasal drainage + sinus pain/pressure + chest congestion No itchy/red eyes No earache No hemoptysis +/- SOB + chills/sweats + fever (up to 101) No nausea No vomiting No abdominal pain No diarrhea No skin rashes + fatigue + myalgias + headache    Past Medical History  Diagnosis Date  . Asthma   . Hypertension   . Asthma 01/11/2012    Past Surgical History  Procedure Laterality Date  . Tonsillectomy and adenoidectomy    . Tubal ligation  2003  . Leep    . Tonsillectomy      Family History  Problem Relation Age of Onset  . Breast cancer      colon cancer    History  Substance Use Topics  . Smoking status: Current Every Day Smoker -- 0.25 packs/day  . Smokeless tobacco: Not on file  . Alcohol Use: Yes    OB History   Grav Para Term Preterm Abortions TAB SAB Ect Mult Living                  Review of Systems  All other systems reviewed and are negative.    Allergies  Vicodin  Home Medications   Current Outpatient Rx  Name  Route  Sig  Dispense  Refill  . topiramate (TOPAMAX) 100 MG tablet   Oral   Take 100 mg by mouth 2 (two) times daily.         Marland Kitchen albuterol (PROVENTIL HFA;VENTOLIN HFA) 108 (90 BASE) MCG/ACT inhaler   Inhalation   Inhale 2 puffs into the lungs every 6 (six) hours as needed for wheezing.   1 Inhaler   2   .  amoxicillin-clavulanate (AUGMENTIN) 875-125 MG per tablet   Oral   Take 1 tablet by mouth 2 (two) times daily.   16 tablet   0   . Budesonide (PULMICORT FLEXHALER) 90 MCG/ACT inhaler   Inhalation   Inhale 2 puffs into the lungs 2 (two) times daily.   1 Inhaler   3   . buPROPion (WELLBUTRIN XL) 300 MG 24 hr tablet   Oral   Take 1 tablet (300 mg total) by mouth daily.   30 tablet   5   . cetirizine (ZYRTEC) 10 MG tablet   Oral   Take 10 mg by mouth daily.         Marland Kitchen escitalopram (LEXAPRO) 20 MG tablet   Oral   Take 1 tablet (20 mg total) by mouth daily.   30 tablet   5     BP 138/93  Pulse 72  Temp(Src) 98.3 F (36.8 C) (Oral)  Resp 18  Ht 5\' 6"  (1.676 m)  Wt 157 lb (71.215 kg)  BMI 25.35 kg/m2  SpO2 96%  Physical Exam  Nursing note and vitals reviewed. Constitutional: She is oriented to person, place, and time. She appears well-developed and well-nourished.  Non-toxic appearance.  She does not have a sickly appearance.  HENT:  Head: Normocephalic and atraumatic.  Right Ear: External ear and ear canal normal. Tympanic membrane is erythematous.  Left Ear: External ear and ear canal normal. Tympanic membrane is erythematous.  Nose: Mucosal edema and rhinorrhea present. Right sinus exhibits maxillary sinus tenderness. Left sinus exhibits maxillary sinus tenderness.  Mouth/Throat: Posterior oropharyngeal erythema present. No oropharyngeal exudate or posterior oropharyngeal edema.  Eyes: No scleral icterus.  Neck: Neck supple.  Cardiovascular: Normal rate, regular rhythm and normal heart sounds.   Pulmonary/Chest: Effort normal and breath sounds normal. No respiratory distress. She has no decreased breath sounds. She has no wheezes. She has no rhonchi.  Lymphadenopathy:       Head (right side): Tonsillar adenopathy present.       Head (left side): Tonsillar adenopathy present.  Neurological: She is alert and oriented to person, place, and time.  Skin: Skin is warm  and dry.  Psychiatric: She has a normal mood and affect. Her speech is normal.    ED Course  Procedures (including critical care time)  Labs Reviewed - No data to display No results found.   1. Acute maxillary sinusitis       MDM  1)  Take the prescribed antibiotic as instructed.  She says that Z-Pak have not worked well for her so we will try Augmentin.  Also gave her a shot of steroid here in clinic.  She is going to continue to use her inhalers.  She is allergic to hydrocodone so we will hold off and cough medicine for now and encourage her to use some over-the-counter medicines.  I do not see any signs of pneumonia however she is worsening in terms of fever and cough the chest x-ray would be appropriate at that time. 2)  Use nasal saline solution (over the counter) at least 3 times a day. 3)  Use over the counter decongestants like Zyrtec-D every 12 hours as needed to help with congestion.  If you have hypertension, do not take medicines with sudafed.  4)  Can take tylenol every 6 hours or motrin every 8 hours for pain or fever. 5)  Follow up with your primary doctor if no improvement in 5-7 days, sooner if increasing pain, fever, or new symptoms.     Marlaine Hind, MD 06/15/12 1902  Marlaine Hind, MD 06/15/12 2000

## 2012-06-15 NOTE — ED Notes (Signed)
Pt c/o chest congestion, productive cough, and fever x 2 wks. She reports taking tylenol, dayquil, and tylenol sinus.

## 2012-08-05 ENCOUNTER — Telehealth: Payer: Self-pay | Admitting: Family Medicine

## 2012-08-05 DIAGNOSIS — F329 Major depressive disorder, single episode, unspecified: Secondary | ICD-10-CM

## 2012-08-05 DIAGNOSIS — F419 Anxiety disorder, unspecified: Secondary | ICD-10-CM

## 2012-08-05 MED ORDER — BUPROPION HCL ER (XL) 300 MG PO TB24: 300.0000 mg | ORAL_TABLET | Freq: Every day | ORAL | Status: DC

## 2012-08-05 NOTE — Telephone Encounter (Signed)
Pt request

## 2012-08-19 ENCOUNTER — Ambulatory Visit (INDEPENDENT_AMBULATORY_CARE_PROVIDER_SITE_OTHER): Payer: 59 | Admitting: Family Medicine

## 2012-08-19 ENCOUNTER — Encounter: Payer: Self-pay | Admitting: Family Medicine

## 2012-08-19 VITALS — BP 127/84 | HR 71 | Wt 150.0 lb

## 2012-08-19 DIAGNOSIS — F329 Major depressive disorder, single episode, unspecified: Secondary | ICD-10-CM

## 2012-08-19 DIAGNOSIS — F419 Anxiety disorder, unspecified: Secondary | ICD-10-CM

## 2012-08-19 DIAGNOSIS — N63 Unspecified lump in unspecified breast: Secondary | ICD-10-CM

## 2012-08-19 DIAGNOSIS — D241 Benign neoplasm of right breast: Secondary | ICD-10-CM

## 2012-08-19 DIAGNOSIS — F411 Generalized anxiety disorder: Secondary | ICD-10-CM

## 2012-08-19 DIAGNOSIS — F988 Other specified behavioral and emotional disorders with onset usually occurring in childhood and adolescence: Secondary | ICD-10-CM

## 2012-08-19 DIAGNOSIS — D249 Benign neoplasm of unspecified breast: Secondary | ICD-10-CM

## 2012-08-19 DIAGNOSIS — N6310 Unspecified lump in the right breast, unspecified quadrant: Secondary | ICD-10-CM

## 2012-08-19 MED ORDER — BUPROPION HCL ER (XL) 300 MG PO TB24: 300.0000 mg | ORAL_TABLET | Freq: Every day | ORAL | Status: DC

## 2012-08-19 MED ORDER — ESCITALOPRAM OXALATE 20 MG PO TABS: 20.0000 mg | ORAL_TABLET | Freq: Every day | ORAL | Status: DC

## 2012-08-19 MED ORDER — AMPHETAMINE-DEXTROAMPHETAMINE 10 MG PO TABS: 10.0000 mg | ORAL_TABLET | Freq: Two times a day (BID) | ORAL | Status: DC

## 2012-08-19 NOTE — Progress Notes (Signed)
CC: Janet Harrison is a 37 y.o. female is here for Breast Mass, Anxiety and Depression   Subjective: HPI:  Followup of anxiety and depression. Patient states she believes anxiety and depression are both well-controlled currently she denies periods of sadness nor restlessness nor subjective anxiety. She's sleeping 6-7 hours a night, still interest in hobbies, still social, no appetite change. She is intentionally losing weight gradually with physical activity and portion control. She expresses frustration with concentration difficulty at home and at work. She finds herself getting lost with distractions at work which is interfering with her productivity. She's becoming disorganized at home and this is impacting children's extracurricular activities. Symptoms are described as moderate in severity on a daily basis slowly worsening since 2011. She had identical symptoms years ago prior to 2011 which were resolved with Adderall however she stopped taking this only because she wanted to minimize her daily medications. She denies paranoia, hallucinations or thoughts wanting to harm herself or others. She denies any history of substance abuse.  Patient complains of right breast has become more tender and has a throbbing component most days of the week. She believes her fibroadenoma is also subjectively enlarging. She would like to know if there is a way to resolve this. She has cut back on caffeine without much help. She denies skin changes, dimpling, nipple retraction, nipple discharge, swollen lymph nodes, nor intentional weight loss     Review Of Systems Outlined In HPI  Past Medical History  Diagnosis Date  . Asthma   . Hypertension   . Asthma 01/11/2012     Family History  Problem Relation Age of Onset  . Breast cancer      colon cancer     History  Substance Use Topics  . Smoking status: Current Every Day Smoker -- 0.25 packs/day  . Smokeless tobacco: Not on file  . Alcohol Use: Yes      Objective: Filed Vitals:   08/19/12 1548  BP: 127/84  Pulse: 71    Vital signs reviewed. General: Alert and Oriented, No Acute Distress HEENT: Pupils equal, round, reactive to light. Conjunctivae clear.  External ears unremarkable.  Moist mucous membranes. Lungs: Clear and comfortable work of breathing, speaking in full sentences without accessory muscle use. Cardiac: Regular rate and rhythm.  Neuro: CN II-XII grossly intact, gait normal. Extremities: No peripheral edema.  Strong peripheral pulses.  Mental Status: No depression, anxiety, nor agitation. Logical though process. Skin: Warm and dry.  Assessment & Plan: Janet Harrison was seen today for breast mass, anxiety and depression.  Diagnoses and associated orders for this visit:  Anxiety - escitalopram (LEXAPRO) 20 MG tablet; Take 1 tablet (20 mg total) by mouth daily. - buPROPion (WELLBUTRIN XL) 300 MG 24 hr tablet; Take 1 tablet (300 mg total) by mouth daily.  Depression - escitalopram (LEXAPRO) 20 MG tablet; Take 1 tablet (20 mg total) by mouth daily. - buPROPion (WELLBUTRIN XL) 300 MG 24 hr tablet; Take 1 tablet (300 mg total) by mouth daily.  Breast mass, right - Ambulatory referral to General Surgery  ADD (attention deficit disorder) - amphetamine-dextroamphetamine (ADDERALL) 10 MG tablet; Take 1 tablet (10 mg total) by mouth 2 (two) times daily.  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 ADD: I believe her symptoms with concentration difficulty is due to her worsening ADD therefore restart Adderall and followup in 4 weeks for reassessment Right breast mass: Worsening pain therefore  referral to breast surgeon for evaluation candidacy for lumpectomy  Return in about 4 weeks (around 09/16/2012).

## 2012-08-26 DIAGNOSIS — N63 Unspecified lump in unspecified breast: Secondary | ICD-10-CM

## 2012-08-26 HISTORY — DX: Unspecified lump in unspecified breast: N63.0

## 2012-09-02 ENCOUNTER — Ambulatory Visit (INDEPENDENT_AMBULATORY_CARE_PROVIDER_SITE_OTHER): Payer: 59 | Admitting: Surgery

## 2012-09-02 ENCOUNTER — Encounter (HOSPITAL_BASED_OUTPATIENT_CLINIC_OR_DEPARTMENT_OTHER): Payer: Self-pay | Admitting: *Deleted

## 2012-09-02 ENCOUNTER — Telehealth (INDEPENDENT_AMBULATORY_CARE_PROVIDER_SITE_OTHER): Payer: Self-pay | Admitting: Surgery

## 2012-09-02 ENCOUNTER — Encounter (INDEPENDENT_AMBULATORY_CARE_PROVIDER_SITE_OTHER): Payer: Self-pay | Admitting: Surgery

## 2012-09-02 VITALS — BP 124/72 | HR 68 | Temp 98.0°F | Resp 14 | Ht 65.0 in | Wt 146.8 lb

## 2012-09-02 DIAGNOSIS — N6310 Unspecified lump in the right breast, unspecified quadrant: Secondary | ICD-10-CM

## 2012-09-02 DIAGNOSIS — N63 Unspecified lump in unspecified breast: Secondary | ICD-10-CM

## 2012-09-02 NOTE — Patient Instructions (Signed)
Lumpectomy, Breast Conserving Surgery A lumpectomy is breast surgery that removes only part of the breast. Another name used may be partial mastectomy. The amount removed varies. Make sure you understand how much of your breast will be removed. Reasons for a lumpectomy:  Any solid breast mass.  Grouped significant nodularity that may be confused with a solitary breast mass. Lumpectomy is the most common form of breast cancer surgery today. The surgeon removes the portion of your breast which contains the tumor (cancer). This is the lump. Some normal tissue around the lump is also removed to be sure that all the tumor has been removed.  If cancer cells are found in the margins where the breast tissue was removed, your surgeon will do more surgery to remove the remaining cancer tissue. This is called re-excision surgery. Radiation and/or chemotherapy treatments are often given following a lumpectomy to kill any cancer cells that could possibly remain.  REASONS YOU MAY NOT BE ABLE TO HAVE BREAST CONSERVING SURGERY:  The tumor is located in more than one place.  Your breast is small and the tumor is large so the breast would be disfigured.  The entire tumor removal is not successful with a lumpectomy.  You cannot commit to a full course of chemotherapy, radiation therapy or are pregnant and cannot have radiation.  You have previously had radiation to the breast to treat cancer. HOW A LUMPECTOMY IS PERFORMED If overnight nursing is not required following a biopsy, a lumpectomy can be performed as a same-day surgery. This can be done in a hospital, clinic, or surgical center. The anesthesia used will depend on your surgeon. They will discuss this with you. A general anesthetic keeps you sleeping through the procedure. LET YOUR CAREGIVERS KNOW ABOUT THE FOLLOWING:  Allergies  Medications taken including herbs, eye drops, over the counter medications, and creams.  Use of steroids (by mouth or  creams)  Previous problems with anesthetics or Novocaine.  Possibility of pregnancy, if this applies  History of blood clots (thrombophlebitis)  History of bleeding or blood problems.  Previous surgery  Other health problems BEFORE THE PROCEDURE You should be present one hour prior to your procedure unless directed otherwise.  AFTER THE PROCEDURE  After surgery, you will be taken to the recovery area where a nurse will watch and check your progress. Once you're awake, stable, and taking fluids well, barring other problems you will be allowed to go home.  Ice packs applied to your operative site may help with discomfort and keep the swelling down.  A small rubber drain may be placed in the breast for a couple of days to prevent a hematoma from developing in the breast.  A pressure dressing may be applied for 24 to 48 hours to prevent bleeding.  Keep the wound dry.  You may resume a normal diet and activities as directed. Avoid strenuous activities affecting the arm on the side of the biopsy site such as tennis, swimming, heavy lifting (more than 10 pounds) or pulling.  Bruising in the breast is normal following this procedure.  Wearing a bra - even to bed - may be more comfortable and also help keep the dressing on.  Change dressings as directed.  Only take over-the-counter or prescription medicines for pain, discomfort, or fever as directed by your caregiver. Call for your results as instructed by your surgeon. Remember it is your responsibility to get the results of your lumpectomy if your surgeon asked you to follow-up. Do not assume   everything is fine if you have not heard from your caregiver. SEEK MEDICAL CARE IF:   There is increased bleeding (more than a small spot) from the wound.  You notice redness, swelling, or increasing pain in the wound.  Pus is coming from wound.  An unexplained oral temperature above 102 F (38.9 C) develops.  You notice a foul smell  coming from the wound or dressing. SEEK IMMEDIATE MEDICAL CARE IF:   You develop a rash.  You have difficulty breathing.  You have any allergic problems. Document Released: 02/23/2006 Document Revised: 04/06/2011 Document Reviewed: 05/27/2006 [employer] Patient Information 2014 [employer].  

## 2012-09-02 NOTE — Telephone Encounter (Signed)
Pt to have needle localization 09/06/12 at 1:30 at [facility] of [place]. Please make sure they have the images to do the procedure Thanks

## 2012-09-02 NOTE — Progress Notes (Signed)
Patient ID: Janet Harrison, female   DOB: 01/17/1976, 37 y.o.   MRN: [id number]  Chief Complaint  Patient presents with  . New Evaluation    eval rt br mass    HPI Janet Harrison is a 37 y.o. female.  Patient sent at the request of Dr.Hommel for right breast mass. She underwent a core biopsy of this in April 2014 at [facility] showing fibroadenoma. It's causing discomfort she wishes to have it excised. She thinks it might be a little bit bigger. Her mammogram is not available for review currently. Pathology reviewed showed fibroadenoma. No nipple discharge or other masses. Right breast pain noted. She limits her caffeine. History of breast cancer in her family. HPI  Past Medical History  Diagnosis Date  . Asthma   . Hypertension   . Asthma 01/11/2012    Past Surgical History  Procedure Laterality Date  . Tonsillectomy and adenoidectomy    . Tubal ligation  2003  . Leep    . Tonsillectomy      Family History  Problem Relation Age of Onset  . Breast cancer      colon cancer  . Cancer Maternal Aunt     breast  . Cancer Maternal Grandmother     breast  . Cancer Maternal Grandfather     lung  . Cancer Maternal Aunt     breast  . Cancer Maternal Aunt     ovarian  . Cancer Maternal Aunt     ovarian  . Cancer Maternal Aunt     lung    Social History History  Substance Use Topics  . Smoking status: Current Every Day Smoker -- 0.25 packs/day  . Smokeless tobacco: Never Used  . Alcohol Use: 1.2 oz/week    2 Glasses of wine per week     Comment: weekly    Allergies  Allergen Reactions  . Darvocet (Propoxyphene-Acetaminophen) Swelling    shortness of breath  . Percocet (Oxycodone-Acetaminophen) Swelling  . Vicodin (Hydrocodone-Acetaminophen)     Current Outpatient Prescriptions  Medication Sig Dispense Refill  . albuterol (PROVENTIL HFA;VENTOLIN HFA) 108 (90 BASE) MCG/ACT inhaler Inhale 2 puffs into the lungs every 6 (six) hours as needed for wheezing.  1  Inhaler  2  . amphetamine-dextroamphetamine (ADDERALL) 10 MG tablet Take 1 tablet (10 mg total) by mouth 2 (two) times daily.  60 tablet  0  . buPROPion (WELLBUTRIN XL) 300 MG 24 hr tablet Take 1 tablet (300 mg total) by mouth daily.  30 tablet  5  . cetirizine (ZYRTEC) 10 MG tablet Take 10 mg by mouth daily.      . escitalopram (LEXAPRO) 20 MG tablet Take 1 tablet (20 mg total) by mouth daily.  30 tablet  5  . topiramate (TOPAMAX) 100 MG tablet Take 100 mg by mouth 2 (two) times daily.      . Budesonide (PULMICORT FLEXHALER) 90 MCG/ACT inhaler Inhale 2 puffs into the lungs 2 (two) times daily.  1 Inhaler  3   No current facility-administered medications for this visit.    Review of Systems Review of Systems  Constitutional: Negative for fever, chills and unexpected weight change.  HENT: Negative for hearing loss, congestion, sore throat, trouble swallowing and voice change.   Eyes: Negative for visual disturbance.  Respiratory: Negative for cough and wheezing.   Cardiovascular: Negative for chest pain, palpitations and leg swelling.  Gastrointestinal: Negative for nausea, vomiting, abdominal pain, diarrhea, constipation, blood in stool, abdominal distention and anal bleeding.    Genitourinary: Negative for hematuria, vaginal bleeding and difficulty urinating.  Musculoskeletal: Negative for arthralgias.  Skin: Negative for rash and wound.  Neurological: Negative for seizures, syncope and headaches.  Hematological: Negative for adenopathy. Does not bruise/bleed easily.  Psychiatric/Behavioral: Negative for confusion.    Blood pressure 124/72, pulse 68, temperature 98 F (36.7 C), temperature source Temporal, resp. rate 14, height 5' 5" (1.651 m), weight 146 lb 12.8 oz (66.588 kg).  Physical Exam Physical Exam  Constitutional: She is oriented to person, place, and time. She appears well-developed and well-nourished.  HENT:  Head: Normocephalic and atraumatic.  Eyes: Pupils are equal,  round, and reactive to light.  Neck: Normal range of motion. Neck supple.  Pulmonary/Chest: Right breast exhibits mass and skin change. Right breast exhibits no inverted nipple, no nipple discharge and no tenderness. Left breast exhibits no inverted nipple, no mass, no nipple discharge, no skin change and no tenderness. Breasts are symmetrical.    Musculoskeletal: Normal range of motion.  Neurological: She is alert and oriented to person, place, and time.  Skin: Skin is warm and dry.  Psychiatric: She has a normal mood and affect. Her behavior is normal. Judgment normal.    Data Reviewed Path from [place].. Right breast fibroadenoma    Assessment    Right breast mass probable fibroadenoma status post core biopsy April 2014 [facility]    Plan    Patient would like to have this removed since is causing discomfort. Recommend right breast needle localized lumpectomy. Risks, benefits and alternative therapies discussed. Nonoperative means discussed. She wishes to proceed.The procedure has been discussed with the patient. Alternatives to surgery have been discussed with the patient.  Risks of surgery include bleeding,  Infection,  Seroma formation, death,  and the need for further surgery.   The patient understands and wishes to proceed.       [name] 09/02/2012, 11:32 AM    

## 2012-09-05 ENCOUNTER — Telehealth (INDEPENDENT_AMBULATORY_CARE_PROVIDER_SITE_OTHER): Payer: Self-pay

## 2012-09-05 NOTE — Telephone Encounter (Signed)
Message copied by Brennan Bailey on Mon Sep 05, 2012  1:27 PM ------      Message from: Docia Chuck      Created: Mon Sep 05, 2012 11:44 AM      Regarding: Please call pt asap       Dr. Luisa Hart said move her surgery.  I have done so and just talked to her about it.  Have r/s for 09/20/12 but she needs to talk to you.            Please try her cell first and if that does not work try the work number.            Thanks ------

## 2012-09-05 NOTE — Telephone Encounter (Signed)
Called patient back. She stated she could get the disk with mammo images and mail it to me. Wanted to know if surgery still needed to be rescheduled if images could arrive today. Explained Dr Luisa Hart still wanted to reschedule due to his on call schedule. She will fed ex the disk to me and i will walk them up to [facility] when I receive them.

## 2012-09-05 NOTE — Telephone Encounter (Signed)
Pt rescheduled at this time to 8/26. She is in process of mailing them to me now.

## 2012-09-16 NOTE — Telephone Encounter (Signed)
Received imaging today. I walked the disks up to [facility].

## 2012-09-19 ENCOUNTER — Ambulatory Visit: Payer: 59 | Admitting: Family Medicine

## 2012-09-20 ENCOUNTER — Ambulatory Visit
Admission: RE | Admit: 2012-09-20 | Discharge: 2012-09-20 | Disposition: A | Discharge status: Home / Self Care | Payer: 59 | Source: Ambulatory Visit | Attending: Surgery | Admitting: Surgery

## 2012-09-20 ENCOUNTER — Encounter (HOSPITAL_BASED_OUTPATIENT_CLINIC_OR_DEPARTMENT_OTHER)
Admission: RE | Disposition: A | Discharge status: Home / Self Care | Payer: Self-pay | Source: Ambulatory Visit | Attending: Surgery

## 2012-09-20 ENCOUNTER — Ambulatory Visit (HOSPITAL_BASED_OUTPATIENT_CLINIC_OR_DEPARTMENT_OTHER)
Admission: RE | Admit: 2012-09-20 | Discharge: 2012-09-20 | Disposition: A | Discharge status: Home / Self Care | Payer: 59 | Source: Ambulatory Visit | Attending: Surgery | Admitting: Surgery

## 2012-09-20 ENCOUNTER — Telehealth (INDEPENDENT_AMBULATORY_CARE_PROVIDER_SITE_OTHER): Payer: Self-pay | Admitting: General Surgery

## 2012-09-20 ENCOUNTER — Encounter (HOSPITAL_BASED_OUTPATIENT_CLINIC_OR_DEPARTMENT_OTHER): Payer: Self-pay | Admitting: Anesthesiology

## 2012-09-20 ENCOUNTER — Ambulatory Visit (HOSPITAL_BASED_OUTPATIENT_CLINIC_OR_DEPARTMENT_OTHER): Payer: 59 | Admitting: Anesthesiology

## 2012-09-20 DIAGNOSIS — I10 Essential (primary) hypertension: Secondary | ICD-10-CM | POA: Insufficient documentation

## 2012-09-20 DIAGNOSIS — N6310 Unspecified lump in the right breast, unspecified quadrant: Secondary | ICD-10-CM

## 2012-09-20 DIAGNOSIS — D249 Benign neoplasm of unspecified breast: Secondary | ICD-10-CM

## 2012-09-20 HISTORY — DX: Depression, unspecified: F32.A

## 2012-09-20 HISTORY — DX: Headache: R51

## 2012-09-20 HISTORY — DX: Major depressive disorder, single episode, unspecified: F32.9

## 2012-09-20 HISTORY — DX: Unspecified lump in unspecified breast: N63.0

## 2012-09-20 HISTORY — DX: Nausea with vomiting, unspecified: R11.2

## 2012-09-20 HISTORY — DX: Other specified postprocedural states: Z98.890

## 2012-09-20 HISTORY — DX: Other specified behavioral and emotional disorders with onset usually occurring in childhood and adolescence: F98.8

## 2012-09-20 HISTORY — PX: BREAST LUMPECTOMY WITH NEEDLE LOCALIZATION: SHX5759

## 2012-09-20 LAB — POCT HEMOGLOBIN-HEMACUE: Hemoglobin: 14.4 g/dL (ref 12.0–15.0)

## 2012-09-20 SURGERY — BREAST LUMPECTOMY WITH NEEDLE LOCALIZATION
Anesthesia: General | Site: Breast | Laterality: Right | Wound class: Clean

## 2012-09-20 MED ORDER — TRAMADOL HCL 50 MG PO TABS: 50.0000 mg | ORAL_TABLET | Freq: Four times a day (QID) | ORAL | Status: DC | PRN

## 2012-09-20 MED ORDER — MIDAZOLAM HCL 2 MG/2ML IJ SOLN: 1.0000 mg | INTRAMUSCULAR | Status: DC | PRN

## 2012-09-20 MED ORDER — LACTATED RINGERS IV SOLN
INTRAVENOUS | Status: DC
  Administered 2012-09-20: 12:00:00 via INTRAVENOUS

## 2012-09-20 MED ORDER — 0.9 % SODIUM CHLORIDE (POUR BTL) OPTIME
TOPICAL | Status: DC | PRN
  Administered 2012-09-20: 200 mL

## 2012-09-20 MED ORDER — OXYCODONE HCL 5 MG/5ML PO SOLN: 5.0000 mg | Freq: Once | ORAL | Status: DC | PRN

## 2012-09-20 MED ORDER — LIDOCAINE HCL (CARDIAC) 20 MG/ML IV SOLN
INTRAVENOUS | Status: DC | PRN
  Administered 2012-09-20: 100 mg via INTRAVENOUS

## 2012-09-20 MED ORDER — FENTANYL CITRATE 0.05 MG/ML IJ SOLN
INTRAMUSCULAR | Status: DC | PRN
  Administered 2012-09-20 (×2): 50 ug via INTRAVENOUS

## 2012-09-20 MED ORDER — TRAMADOL HCL 50 MG PO TABS
50.0000 mg | ORAL_TABLET | Freq: Once | ORAL | Status: AC
  Administered 2012-09-20: 50 mg via ORAL

## 2012-09-20 MED ORDER — DEXTROSE 5 % IV SOLN
3.0000 g | INTRAVENOUS | Status: AC
  Administered 2012-09-20: 2 g via INTRAVENOUS

## 2012-09-20 MED ORDER — DEXAMETHASONE SODIUM PHOSPHATE 4 MG/ML IJ SOLN
INTRAMUSCULAR | Status: DC | PRN
  Administered 2012-09-20: 10 mg via INTRAVENOUS

## 2012-09-20 MED ORDER — METOCLOPRAMIDE HCL 5 MG/ML IJ SOLN: 10.0000 mg | Freq: Once | INTRAMUSCULAR | Status: DC | PRN

## 2012-09-20 MED ORDER — BUPIVACAINE-EPINEPHRINE 0.25% -1:200000 IJ SOLN
INTRAMUSCULAR | Status: DC | PRN
  Administered 2012-09-20: 10 mL

## 2012-09-20 MED ORDER — MIDAZOLAM HCL 2 MG/ML PO SYRP: 12.0000 mg | ORAL_SOLUTION | Freq: Once | ORAL | Status: DC | PRN

## 2012-09-20 MED ORDER — CHLORHEXIDINE GLUCONATE 4 % EX LIQD: 1.0000 "application " | Freq: Once | CUTANEOUS | Status: DC

## 2012-09-20 MED ORDER — SCOPOLAMINE 1 MG/3DAYS TD PT72
1.0000 | MEDICATED_PATCH | Freq: Once | TRANSDERMAL | Status: DC
  Administered 2012-09-20: 1.5 mg via TRANSDERMAL

## 2012-09-20 MED ORDER — HYDROMORPHONE HCL PF 1 MG/ML IJ SOLN
0.2500 mg | INTRAMUSCULAR | Status: DC | PRN
  Administered 2012-09-20 (×4): 0.5 mg via INTRAVENOUS

## 2012-09-20 MED ORDER — ONDANSETRON HCL 4 MG/2ML IJ SOLN
INTRAMUSCULAR | Status: DC | PRN
  Administered 2012-09-20 (×2): 4 mg via INTRAVENOUS

## 2012-09-20 MED ORDER — FENTANYL CITRATE 0.05 MG/ML IJ SOLN: 50.0000 ug | INTRAMUSCULAR | Status: DC | PRN

## 2012-09-20 MED ORDER — PROPOFOL 10 MG/ML IV BOLUS
INTRAVENOUS | Status: DC | PRN
  Administered 2012-09-20: 200 mg via INTRAVENOUS

## 2012-09-20 MED ORDER — OXYCODONE HCL 5 MG PO TABS
5.0000 mg | ORAL_TABLET | Freq: Once | ORAL | Status: DC | PRN
Start: 2012-09-20 — End: 2012-09-20

## 2012-09-20 MED ORDER — MIDAZOLAM HCL 5 MG/5ML IJ SOLN
INTRAMUSCULAR | Status: DC | PRN
  Administered 2012-09-20: 2 mg via INTRAVENOUS

## 2012-09-20 SURGICAL SUPPLY — 48 items
BINDER BREAST LRG (GAUZE/BANDAGES/DRESSINGS) IMPLANT
BINDER BREAST MEDIUM (GAUZE/BANDAGES/DRESSINGS) IMPLANT
BINDER BREAST XLRG (GAUZE/BANDAGES/DRESSINGS) IMPLANT
BINDER BREAST XXLRG (GAUZE/BANDAGES/DRESSINGS) IMPLANT
BLADE SURG 15 STRL LF DISP TIS (BLADE) ×1 IMPLANT
BLADE SURG 15 STRL SS (BLADE) ×1
CANISTER SUCTION 1200CC (MISCELLANEOUS) ×2 IMPLANT
CHLORAPREP W/TINT 26ML (MISCELLANEOUS) ×2 IMPLANT
CLIP TI WIDE RED SMALL 6 (CLIP) IMPLANT
CLOTH BEACON ORANGE TIMEOUT ST (SAFETY) ×2 IMPLANT
COVER MAYO STAND STRL (DRAPES) ×2 IMPLANT
COVER TABLE BACK 60X90 (DRAPES) ×2 IMPLANT
DECANTER SPIKE VIAL GLASS SM (MISCELLANEOUS) IMPLANT
DERMABOND ADVANCED (GAUZE/BANDAGES/DRESSINGS) ×1
DERMABOND ADVANCED .7 DNX12 (GAUZE/BANDAGES/DRESSINGS) ×1 IMPLANT
DEVICE DUBIN W/COMP PLATE 8390 (MISCELLANEOUS) ×2 IMPLANT
DRAPE LAPAROSCOPIC ABDOMINAL (DRAPES) IMPLANT
DRAPE PED LAPAROTOMY (DRAPES) ×2 IMPLANT
DRAPE UTILITY XL STRL (DRAPES) ×2 IMPLANT
ELECT COATED BLADE 2.86 ST (ELECTRODE) ×2 IMPLANT
ELECT REM PT RETURN 9FT ADLT (ELECTROSURGICAL) ×2
ELECTRODE REM PT RTRN 9FT ADLT (ELECTROSURGICAL) ×1 IMPLANT
GLOVE BIO SURGEON STRL SZ7 (GLOVE) ×4 IMPLANT
GLOVE BIOGEL PI IND STRL 7.0 (GLOVE) ×1 IMPLANT
GLOVE BIOGEL PI IND STRL 7.5 (GLOVE) ×1 IMPLANT
GLOVE BIOGEL PI IND STRL 8 (GLOVE) ×1 IMPLANT
GLOVE BIOGEL PI INDICATOR 7.0 (GLOVE) ×1
GLOVE BIOGEL PI INDICATOR 7.5 (GLOVE) ×1
GLOVE BIOGEL PI INDICATOR 8 (GLOVE) ×1
GLOVE ECLIPSE 8.0 STRL XLNG CF (GLOVE) ×2 IMPLANT
GLOVE EXAM NITRILE MD LF STRL (GLOVE) ×2 IMPLANT
GOWN PREVENTION PLUS XLARGE (GOWN DISPOSABLE) ×4 IMPLANT
KIT MARKER MARGIN INK (KITS) IMPLANT
NEEDLE HYPO 25X1 1.5 SAFETY (NEEDLE) ×2 IMPLANT
NS IRRIG 1000ML POUR BTL (IV SOLUTION) ×2 IMPLANT
PACK BASIN DAY SURGERY FS (CUSTOM PROCEDURE TRAY) ×2 IMPLANT
PENCIL BUTTON HOLSTER BLD 10FT (ELECTRODE) ×2 IMPLANT
SLEEVE SCD COMPRESS KNEE MED (MISCELLANEOUS) ×2 IMPLANT
SPONGE LAP 4X18 X RAY DECT (DISPOSABLE) ×2 IMPLANT
SUT MON AB 4-0 PC3 18 (SUTURE) ×2 IMPLANT
SUT SILK 2 0 SH (SUTURE) IMPLANT
SUT VIC AB 3-0 SH 27 (SUTURE) ×1
SUT VIC AB 3-0 SH 27X BRD (SUTURE) ×1 IMPLANT
SYR CONTROL 10ML LL (SYRINGE) ×2 IMPLANT
TOWEL OR 17X24 6PK STRL BLUE (TOWEL DISPOSABLE) ×2 IMPLANT
TOWEL OR NON WOVEN STRL DISP B (DISPOSABLE) ×2 IMPLANT
TUBE CONNECTING 20X1/4 (TUBING) ×2 IMPLANT
YANKAUER SUCT BULB TIP NO VENT (SUCTIONS) ×2 IMPLANT

## 2012-09-20 NOTE — H&P (View-Only) (Signed)
Patient ID: Janet Harrison, female   DOB: 08-17-1975, 37 y.o.   MRN: [id number]  Chief Complaint  Patient presents with  . New Evaluation    eval rt br mass    HPI Janet Harrison is a 37 y.o. female.  Patient sent at the request of Dr.Hommel for right breast mass. She underwent a core biopsy of this in April 2014 at [facility] showing fibroadenoma. It's causing discomfort she wishes to have it excised. She thinks it might be a little bit bigger. Her mammogram is not available for review currently. Pathology reviewed showed fibroadenoma. No nipple discharge or other masses. Right breast pain noted. She limits her caffeine. History of breast cancer in her family. HPI  Past Medical History  Diagnosis Date  . Asthma   . Hypertension   . Asthma 01/11/2012    Past Surgical History  Procedure Laterality Date  . Tonsillectomy and adenoidectomy    . Tubal ligation  2003  . Leep    . Tonsillectomy      Family History  Problem Relation Age of Onset  . Breast cancer      colon cancer  . Cancer Maternal Aunt     breast  . Cancer Maternal Grandmother     breast  . Cancer Maternal Grandfather     lung  . Cancer Maternal Aunt     breast  . Cancer Maternal Aunt     ovarian  . Cancer Maternal Aunt     ovarian  . Cancer Maternal Aunt     lung    Social History History  Substance Use Topics  . Smoking status: Current Every Day Smoker -- 0.25 packs/day  . Smokeless tobacco: Never Used  . Alcohol Use: 1.2 oz/week    2 Glasses of wine per week     Comment: weekly    Allergies  Allergen Reactions  . Darvocet (Propoxyphene-Acetaminophen) Swelling    shortness of breath  . Percocet (Oxycodone-Acetaminophen) Swelling  . Vicodin (Hydrocodone-Acetaminophen)     Current Outpatient Prescriptions  Medication Sig Dispense Refill  . albuterol (PROVENTIL HFA;VENTOLIN HFA) 108 (90 BASE) MCG/ACT inhaler Inhale 2 puffs into the lungs every 6 (six) hours as needed for wheezing.  1  Inhaler  2  . amphetamine-dextroamphetamine (ADDERALL) 10 MG tablet Take 1 tablet (10 mg total) by mouth 2 (two) times daily.  60 tablet  0  . buPROPion (WELLBUTRIN XL) 300 MG 24 hr tablet Take 1 tablet (300 mg total) by mouth daily.  30 tablet  5  . cetirizine (ZYRTEC) 10 MG tablet Take 10 mg by mouth daily.      Marland Kitchen escitalopram (LEXAPRO) 20 MG tablet Take 1 tablet (20 mg total) by mouth daily.  30 tablet  5  . topiramate (TOPAMAX) 100 MG tablet Take 100 mg by mouth 2 (two) times daily.      . Budesonide (PULMICORT FLEXHALER) 90 MCG/ACT inhaler Inhale 2 puffs into the lungs 2 (two) times daily.  1 Inhaler  3   No current facility-administered medications for this visit.    Review of Systems Review of Systems  Constitutional: Negative for fever, chills and unexpected weight change.  HENT: Negative for hearing loss, congestion, sore throat, trouble swallowing and voice change.   Eyes: Negative for visual disturbance.  Respiratory: Negative for cough and wheezing.   Cardiovascular: Negative for chest pain, palpitations and leg swelling.  Gastrointestinal: Negative for nausea, vomiting, abdominal pain, diarrhea, constipation, blood in stool, abdominal distention and anal bleeding.  Genitourinary: Negative for hematuria, vaginal bleeding and difficulty urinating.  Musculoskeletal: Negative for arthralgias.  Skin: Negative for rash and wound.  Neurological: Negative for seizures, syncope and headaches.  Hematological: Negative for adenopathy. Does not bruise/bleed easily.  Psychiatric/Behavioral: Negative for confusion.    Blood pressure 124/72, pulse 68, temperature 98 F (36.7 C), temperature source Temporal, resp. rate 14, height 5\' 5"  (1.651 m), weight 146 lb 12.8 oz (66.588 kg).  Physical Exam Physical Exam  Constitutional: She is oriented to person, place, and time. She appears well-developed and well-nourished.  HENT:  Head: Normocephalic and atraumatic.  Eyes: Pupils are equal,  round, and reactive to light.  Neck: Normal range of motion. Neck supple.  Pulmonary/Chest: Right breast exhibits mass and skin change. Right breast exhibits no inverted nipple, no nipple discharge and no tenderness. Left breast exhibits no inverted nipple, no mass, no nipple discharge, no skin change and no tenderness. Breasts are symmetrical.    Musculoskeletal: Normal range of motion.  Neurological: She is alert and oriented to person, place, and time.  Skin: Skin is warm and dry.  Psychiatric: She has a normal mood and affect. Her behavior is normal. Judgment normal.    Data Reviewed Path from [place].. 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 Recommend right breast needle localized lumpectomy. Risks, benefits and alternative therapies discussed. Nonoperative means discussed. She wishes to proceed.The procedure has been discussed with the patient. Alternatives to surgery have been discussed with the patient.  Risks of surgery include bleeding,  Infection,  Seroma formation, death,  and the need for further surgery.   The patient understands and wishes to proceed.       Cassie Shedlock A. 09/02/2012, 11:32 AM

## 2012-09-20 NOTE — Transfer of Care (Signed)
Immediate Anesthesia Transfer of Care Note  Patient: Janet Harrison  Procedure(s) Performed: Procedure(s): BREAST LUMPECTOMY WITH NEEDLE LOCALIZATION (Right)  Patient Location: PACU  Anesthesia Type:General  Level of Consciousness: awake and alert   Airway & Oxygen Therapy: Patient Spontanous Breathing and Patient connected to face mask oxygen  Post-op Assessment: Report given to PACU RN and Post -op Vital signs reviewed and stable  Post vital signs: Reviewed and stable  Complications: No apparent anesthesia complications

## 2012-09-20 NOTE — Interval H&P Note (Signed)
History and Physical Interval Note:  09/20/2012 11:41 AM  Janet Harrison  has presented today for surgery, with the diagnosis of right breast lump  The various methods of treatment have been discussed with the patient and family. After consideration of risks, benefits and other options for treatment, the patient has consented to  Procedure(s): BREAST LUMPECTOMY WITH NEEDLE LOCALIZATION (Right) as a surgical intervention .  The patient's history has been reviewed, patient examined, no change in status, stable for surgery.  I have reviewed the patient's chart and labs.  Questions were answered to the patient's satisfaction.     Ridhima Golberg A.

## 2012-09-20 NOTE — Telephone Encounter (Signed)
Her mother called saying she was not getting good pain relief from the Ultram after the right breast lumpectomy today.  She also recently took Advil 400 mg.  She is using ice.  I told her Mom she could increase the Advil to 800 mg every 8 hours, find a comfortable position, keep using the ice, and take the Ultram every 4 hours for the first 24 hours.  If she still has poor pain control tomorrow, I told her to call the office.

## 2012-09-20 NOTE — Op Note (Signed)
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 The procedure has been discussed with the patient. Alternatives to surgery have been discussed with the patient.  Risks of surgery include bleeding,  Infection,  Seroma formation, death,  and the need for further surgery.   The patient understands and wishes to proceed.  Description of procedure: The patient was seen in the holding area and the plans for the procedure reviewed. The right breast was marked as the operative side. The wire localizing films were reviewed.  The patient was taken to the operating room and after satisfactory general anesthesia had been obtained the right breast was prepped and draped and the timeout was performed.  The incision was made over the presumed area of the mass. Skin flaps were raised and using cautery the area was completely excised. Bleeders were controlled with either cautery or sutures as needed. Radiograph shows clip,  Wire and mass in specimen.  After achieving hemostasis, the incision was closed with 3-0 Vicryl, 4-0 Monocryl subcuticular, and Dermabond.  The patient tolerated the procedure well. There were no operative complications. All counts were correct.   EBL: minimal  Dortha Schwalbe, MD, FACS 09/20/2012 12:41 PM

## 2012-09-20 NOTE — Anesthesia Postprocedure Evaluation (Signed)
  Anesthesia Post-op Note  Patient: Janet Harrison  Procedure(s) Performed: Procedure(s): BREAST LUMPECTOMY WITH NEEDLE LOCALIZATION (Right)  Patient Location: PACU  Anesthesia Type:General  Level of Consciousness: awake and alert   Airway and Oxygen Therapy: Patient Spontanous Breathing  Post-op Pain: mild  Post-op Assessment: Post-op Vital signs reviewed, Patient's Cardiovascular Status Stable, Respiratory Function Stable and Patent Airway  Post-op Vital Signs: Reviewed and stable  Complications: No apparent anesthesia complications

## 2012-09-20 NOTE — Anesthesia Procedure Notes (Signed)
Procedure Name: LMA Insertion Date/Time: 09/20/2012 12:02 PM Performed by: Zenia Resides D Pre-anesthesia Checklist: Patient identified, Emergency Drugs available, Suction available and Patient being monitored Patient Re-evaluated:Patient Re-evaluated prior to inductionOxygen Delivery Method: Circle System Utilized Preoxygenation: Pre-oxygenation with 100% oxygen Intubation Type: IV induction Ventilation: Mask ventilation without difficulty LMA: LMA inserted LMA Size: 3.0 Number of attempts: 1 Airway Equipment and Method: bite block Placement Confirmation: positive ETCO2 Tube secured with: Tape Dental Injury: Teeth and Oropharynx as per pre-operative assessment

## 2012-09-20 NOTE — Anesthesia Preprocedure Evaluation (Signed)
Anesthesia Evaluation  Patient identified by MRN, date of birth, ID band Patient awake    Reviewed: Allergy & Precautions, H&P , NPO status , Patient's Chart, lab work & pertinent test results, reviewed documented beta blocker date and time   History of Anesthesia Complications (+) PONV  Airway Mallampati: II TM Distance: >3 FB Neck ROM: full    Dental   Pulmonary asthma , Current Smoker,  breath sounds clear to auscultation        Cardiovascular negative cardio ROS  Rhythm:regular     Neuro/Psych  Headaches, PSYCHIATRIC DISORDERS Anxiety Depression    GI/Hepatic negative GI ROS, Neg liver ROS,   Endo/Other  negative endocrine ROS  Renal/GU negative Renal ROS  negative genitourinary   Musculoskeletal   Abdominal   Peds  Hematology negative hematology ROS (+)   Anesthesia Other Findings See surgeon's H&P   Reproductive/Obstetrics negative OB ROS                           Anesthesia Physical Anesthesia Plan  ASA: II  Anesthesia Plan: General   Post-op Pain Management:    Induction: Intravenous  Airway Management Planned: LMA  Additional Equipment:   Intra-op Plan:   Post-operative Plan:   Informed Consent: I have reviewed the patients History and Physical, chart, labs and discussed the procedure including the risks, benefits and alternatives for the proposed anesthesia with the patient or authorized representative who has indicated his/her understanding and acceptance.   Dental Advisory Given  Plan Discussed with: CRNA and Surgeon  Anesthesia Plan Comments:         Anesthesia Quick Evaluation

## 2012-09-21 ENCOUNTER — Ambulatory Visit: Payer: 59 | Admitting: Family Medicine

## 2012-09-21 ENCOUNTER — Encounter (HOSPITAL_BASED_OUTPATIENT_CLINIC_OR_DEPARTMENT_OTHER): Payer: Self-pay | Admitting: Surgery

## 2012-09-23 ENCOUNTER — Telehealth (INDEPENDENT_AMBULATORY_CARE_PROVIDER_SITE_OTHER): Payer: Self-pay

## 2012-09-23 ENCOUNTER — Telehealth (INDEPENDENT_AMBULATORY_CARE_PROVIDER_SITE_OTHER): Payer: Self-pay | Admitting: General Surgery

## 2012-09-23 ENCOUNTER — Ambulatory Visit (INDEPENDENT_AMBULATORY_CARE_PROVIDER_SITE_OTHER): Payer: 59 | Admitting: Family Medicine

## 2012-09-23 ENCOUNTER — Encounter: Payer: Self-pay | Admitting: Family Medicine

## 2012-09-23 ENCOUNTER — Encounter (INDEPENDENT_AMBULATORY_CARE_PROVIDER_SITE_OTHER): Payer: 59 | Admitting: Surgery

## 2012-09-23 VITALS — BP 126/87 | HR 76 | Wt 150.0 lb

## 2012-09-23 DIAGNOSIS — F988 Other specified behavioral and emotional disorders with onset usually occurring in childhood and adolescence: Secondary | ICD-10-CM

## 2012-09-23 MED ORDER — AMPHETAMINE-DEXTROAMPHETAMINE 10 MG PO TABS: 10.0000 mg | ORAL_TABLET | Freq: Two times a day (BID) | ORAL | Status: DC

## 2012-09-23 NOTE — Telephone Encounter (Signed)
Message copied by Brennan Bailey on Fri Sep 23, 2012  4:01 PM ------      Message from: Harriette Bouillon A      Created: Thu Sep 22, 2012  1:45 PM       B9 ------

## 2012-09-23 NOTE — Telephone Encounter (Signed)
Tried calling pt, no answer or VM available. Path benign.

## 2012-09-23 NOTE — Progress Notes (Signed)
CC: Janet Harrison is a 37 y.o. female is here for f/u Adderall   Subjective: HPI:  Followup ADD: Since I saw her last she has been taking Adderall 10 mg one to 2 times a day less thanof the week. She only uses on days that she knows that she needs to meet deadlines at work, it has improved her concentration and task completion Along with goal oriented activities. She notices it does suppress her appetite however only if she takes on an empty stomach. She denies unintentional weight loss or skipping meals. She denies depression, anxiety, paranoia, tremor, sleep disturbance, nor exertional chest pain nor irregular heartbeat.   Review Of Systems Outlined In HPI  Past Medical History  Diagnosis Date  . PONV (postoperative nausea and vomiting)   . Headache(784.0)     migraines  . Depression   . Asthma     prn inhalers  . ADD (attention deficit disorder)   . Breast lump 08/2012    right     Family History  Problem Relation Age of Onset  . Lung cancer Maternal Aunt   . Breast cancer Maternal Aunt   . Breast cancer Maternal Grandmother   . Lung cancer Maternal Grandfather   . Breast cancer Maternal Aunt   . Ovarian cancer Maternal Aunt   . Ovarian cancer Maternal Aunt      History  Substance Use Topics  . Smoking status: Current Every Day Smoker -- 23 years    Types: Cigarettes  . Smokeless tobacco: Never Used     Comment: 6-7 cig./day  . Alcohol Use: 1.2 oz/week    2 Glasses of wine per week     Comment: 2 glasses wine/week     Objective: Filed Vitals:   09/23/12 0848  BP: 126/87  Pulse: 76    General: Alert and Oriented, No Acute Distress HEENT: Pupils equal, round, reactive to light. Conjunctivae clear.   moist mucous membranes pharynx unremarkable  Lungs: Clear to auscultation bilaterally, no wheezing/ronchi/rales.  Comfortable work of breathing. Good air movement. Cardiac: Regular rate and rhythm. Normal S1/S2.  No murmurs, rubs, nor gallops.   Extremities: No  peripheral edema.  Strong peripheral pulses.  Mental Status: No depression, anxiety, nor agitation. Skin: Warm and dry.  Assessment & Plan: Janet Harrison was seen today for f/u adderall.  Diagnoses and associated orders for this visit:  ADD (attention deficit disorder) - amphetamine-dextroamphetamine (ADDERALL) 10 MG tablet; Take 1 tablet (10 mg total) by mouth 2 (two) times daily.    ADD: Controlled she'll continue use Adderall on as-needed basis, she understands not to take more than 2 a day.   We discussed favorable pathology results from her lobectomy she is also going to followup with her surgeon  Return in about 3 months (around 12/24/2012).

## 2012-09-23 NOTE — Telephone Encounter (Signed)
Patient called back and I gave her path results 

## 2012-10-03 ENCOUNTER — Encounter (INDEPENDENT_AMBULATORY_CARE_PROVIDER_SITE_OTHER): Payer: 59 | Admitting: Surgery

## 2012-10-14 ENCOUNTER — Encounter (INDEPENDENT_AMBULATORY_CARE_PROVIDER_SITE_OTHER): Payer: 59 | Admitting: Surgery

## 2012-10-24 ENCOUNTER — Encounter (INDEPENDENT_AMBULATORY_CARE_PROVIDER_SITE_OTHER): Payer: Self-pay | Admitting: Surgery

## 2012-10-26 ENCOUNTER — Other Ambulatory Visit: Payer: Self-pay | Admitting: *Deleted

## 2012-10-26 DIAGNOSIS — F988 Other specified behavioral and emotional disorders with onset usually occurring in childhood and adolescence: Secondary | ICD-10-CM

## 2012-10-26 MED ORDER — AMPHETAMINE-DEXTROAMPHETAMINE 10 MG PO TABS: 10.0000 mg | ORAL_TABLET | Freq: Two times a day (BID) | ORAL | Status: DC

## 2012-11-23 ENCOUNTER — Other Ambulatory Visit: Payer: Self-pay | Admitting: *Deleted

## 2012-11-23 DIAGNOSIS — F988 Other specified behavioral and emotional disorders with onset usually occurring in childhood and adolescence: Secondary | ICD-10-CM

## 2012-11-23 MED ORDER — AMPHETAMINE-DEXTROAMPHETAMINE 10 MG PO TABS: 10.0000 mg | ORAL_TABLET | Freq: Two times a day (BID) | ORAL | Status: DC

## 2012-12-29 ENCOUNTER — Other Ambulatory Visit: Payer: Self-pay | Admitting: *Deleted

## 2012-12-29 DIAGNOSIS — F988 Other specified behavioral and emotional disorders with onset usually occurring in childhood and adolescence: Secondary | ICD-10-CM

## 2012-12-29 MED ORDER — AMPHETAMINE-DEXTROAMPHETAMINE 10 MG PO TABS: 10.0000 mg | ORAL_TABLET | Freq: Two times a day (BID) | ORAL | Status: DC

## 2013-01-10 ENCOUNTER — Ambulatory Visit (INDEPENDENT_AMBULATORY_CARE_PROVIDER_SITE_OTHER): Payer: 59 | Admitting: Family Medicine

## 2013-01-10 ENCOUNTER — Encounter: Payer: Self-pay | Admitting: Family Medicine

## 2013-01-10 VITALS — BP 152/107 | HR 80 | Wt 139.0 lb

## 2013-01-10 DIAGNOSIS — F3289 Other specified depressive episodes: Secondary | ICD-10-CM

## 2013-01-10 DIAGNOSIS — F411 Generalized anxiety disorder: Secondary | ICD-10-CM

## 2013-01-10 DIAGNOSIS — I10 Essential (primary) hypertension: Secondary | ICD-10-CM

## 2013-01-10 DIAGNOSIS — F988 Other specified behavioral and emotional disorders with onset usually occurring in childhood and adolescence: Secondary | ICD-10-CM

## 2013-01-10 DIAGNOSIS — J45909 Unspecified asthma, uncomplicated: Secondary | ICD-10-CM

## 2013-01-10 DIAGNOSIS — Z23 Encounter for immunization: Secondary | ICD-10-CM

## 2013-01-10 DIAGNOSIS — F329 Major depressive disorder, single episode, unspecified: Secondary | ICD-10-CM

## 2013-01-10 DIAGNOSIS — F419 Anxiety disorder, unspecified: Secondary | ICD-10-CM

## 2013-01-10 MED ORDER — ALBUTEROL SULFATE HFA 108 (90 BASE) MCG/ACT IN AERS: 2.0000 | INHALATION_SPRAY | Freq: Four times a day (QID) | RESPIRATORY_TRACT | Status: DC | PRN

## 2013-01-10 MED ORDER — ESCITALOPRAM OXALATE 20 MG PO TABS: 20.0000 mg | ORAL_TABLET | Freq: Every day | ORAL | Status: DC

## 2013-01-10 MED ORDER — BUDESONIDE 90 MCG/ACT IN AEPB: 2.0000 | INHALATION_SPRAY | Freq: Two times a day (BID) | RESPIRATORY_TRACT | Status: DC

## 2013-01-10 MED ORDER — BUPROPION HCL ER (XL) 300 MG PO TB24: 300.0000 mg | ORAL_TABLET | Freq: Every day | ORAL | Status: DC

## 2013-01-10 MED ORDER — LISINOPRIL 20 MG PO TABS: 20.0000 mg | ORAL_TABLET | Freq: Every day | ORAL | Status: DC

## 2013-01-10 NOTE — Progress Notes (Signed)
CC: Janet Harrison is a 37 y.o. female is here for f/u meds   Subjective: HPI:  Followup ADD: Continues on Adderall 10 mg twice a day. Reports significant and continued improvement with concentration and task completion without getting interrupted with nonessential distractions. Denies anxiety, depression, nor mental disturbance. Denies appetite suppression or unintentional weight loss.  Followup anxiety and depression: Continues on Wellbutrin and Lexapro. Expresses a desire to stop one of these however will be losing insurance in the next week for the following 3 months. She states that her anxiety and depression is extremely well controlled right now she is quite happy with life and looking forward to a new job.    Followup asthma: Continues on Pulmicort 2 puffs twice a day denies wheezing, cough, shortness of breath, chest tightness nor any pulmonary complaints. She cannot remember the last time she uses albuterol  Elevated blood pressure: When she was pregnant with one of her children she required blood pressure medication however was able to get off of this medication after one-2 years following the delivery. No outside blood pressures to report. Denies chest pain, shortness of breath, orthopnea, peripheral edema. Does endorse occasional headache hard to localize constant nothing makes better or worse other than a going away after hours of rest  Review Of Systems Outlined In HPI  Past Medical History  Diagnosis Date  . PONV (postoperative nausea and vomiting)   . Headache(784.0)     migraines  . Depression   . Asthma     prn inhalers  . ADD (attention deficit disorder)   . Breast lump 08/2012    right     Family History  Problem Relation Age of Onset  . Lung cancer Maternal Aunt   . Breast cancer Maternal Aunt   . Breast cancer Maternal Grandmother   . Lung cancer Maternal Grandfather   . Breast cancer Maternal Aunt   . Ovarian cancer Maternal Aunt   . Ovarian cancer Maternal  Aunt      History  Substance Use Topics  . Smoking status: Current Every Day Smoker -- 23 years    Types: Cigarettes  . Smokeless tobacco: Never Used     Comment: 6-7 cig./day  . Alcohol Use: 1.2 oz/week    2 Glasses of wine per week     Comment: 2 glasses wine/week     Objective: Filed Vitals:   01/10/13 1451  BP: 152/107  Pulse: 80    Vital signs reviewed. General: Alert and Oriented, No Acute Distress HEENT: Pupils equal, round, reactive to light. Conjunctivae clear.  External ears unremarkable.  Moist mucous membranes. Lungs: Clear and comfortable work of breathing, speaking in full sentences without accessory muscle use. Cardiac: Regular rate and rhythm. No murmurs rubs or gallops Neuro: CN II-XII grossly intact, gait normal. Extremities: No peripheral edema.  Strong peripheral pulses.  Mental Status: No depression, anxiety, nor agitation. Logical though process. Skin: Warm and dry.  Assessment & Plan: Janet Harrison was seen today for f/u meds.  Diagnoses and associated orders for this visit:  ADD (attention deficit disorder)  Essential hypertension, benign - lisinopril (PRINIVIL,ZESTRIL) 20 MG tablet; Take 1 tablet (20 mg total) by mouth daily.  Anxiety - buPROPion (WELLBUTRIN XL) 300 MG 24 hr tablet; Take 1 tablet (300 mg total) by mouth daily. - escitalopram (LEXAPRO) 20 MG tablet; Take 1 tablet (20 mg total) by mouth daily.  Depression - buPROPion (WELLBUTRIN XL) 300 MG 24 hr tablet; Take 1 tablet (300 mg total) by mouth  daily. - escitalopram (LEXAPRO) 20 MG tablet; Take 1 tablet (20 mg total) by mouth daily.  Asthma - Budesonide (PULMICORT FLEXHALER) 90 MCG/ACT inhaler; Inhale 2 puffs into the lungs 2 (two) times daily.  Asthma, chronic - Budesonide (PULMICORT FLEXHALER) 90 MCG/ACT inhaler; Inhale 2 puffs into the lungs 2 (two) times daily. - albuterol (PROVENTIL HFA;VENTOLIN HFA) 108 (90 BASE) MCG/ACT inhaler; Inhale 2 puffs into the lungs every 6 (six) hours  as needed for wheezing.  Need for prophylactic vaccination and inoculation against influenza    Essential hypertension: Uncontrolled start lisinopril keep blood pressure diary and let me know the response 1-2 weeks after starting medication. Ideally I like her to followup in 2-4 weeks however she admits she would not be able to come due to expense of paying out-of-pocket Asthma: Controlled continue Pulmicort and albuterol Depression and anxiety: Controlled I more than happy to help with tapering off of Wellbutrin however joint decision was made to continue on current Wellbutrin and Lexapro regimen until she gets her insurance back in the spring which would make titration options more financially sensible, 90 day supply of the above ADD: Controlled continue Adderall    Return in about 3 months (around 04/10/2013).

## 2013-01-27 ENCOUNTER — Other Ambulatory Visit: Payer: Self-pay | Admitting: *Deleted

## 2013-01-27 DIAGNOSIS — F988 Other specified behavioral and emotional disorders with onset usually occurring in childhood and adolescence: Secondary | ICD-10-CM

## 2013-01-27 MED ORDER — AMPHETAMINE-DEXTROAMPHETAMINE 10 MG PO TABS: 10.0000 mg | ORAL_TABLET | Freq: Two times a day (BID) | ORAL | Status: DC

## 2013-04-17 IMAGING — CR DG CHEST 2V
2 series · 2 of 2 positions shown · non-contrast
Comparison: None.

CLINICAL DATA: Wheezing with shortness of breath and cough for 4
days.

CHEST - 2 VIEW

[view not recorded (1 of 2)]
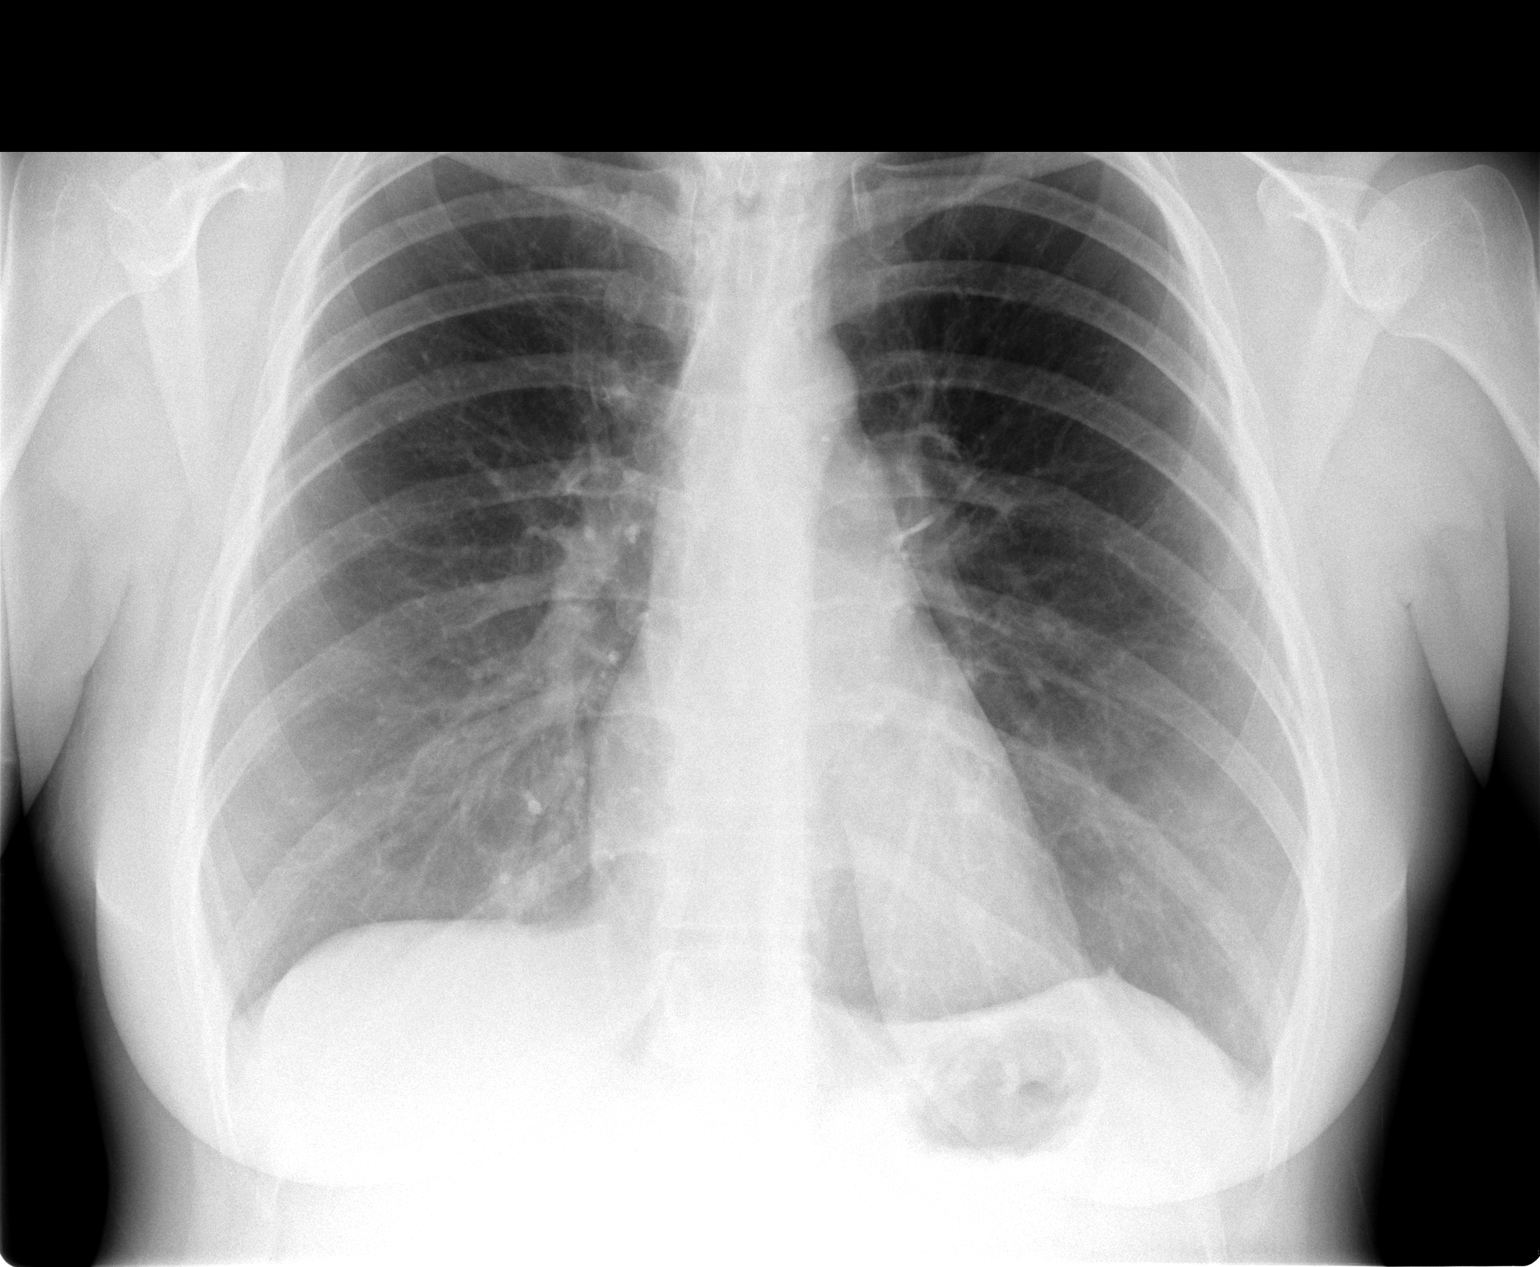

[view not recorded (2 of 2)]
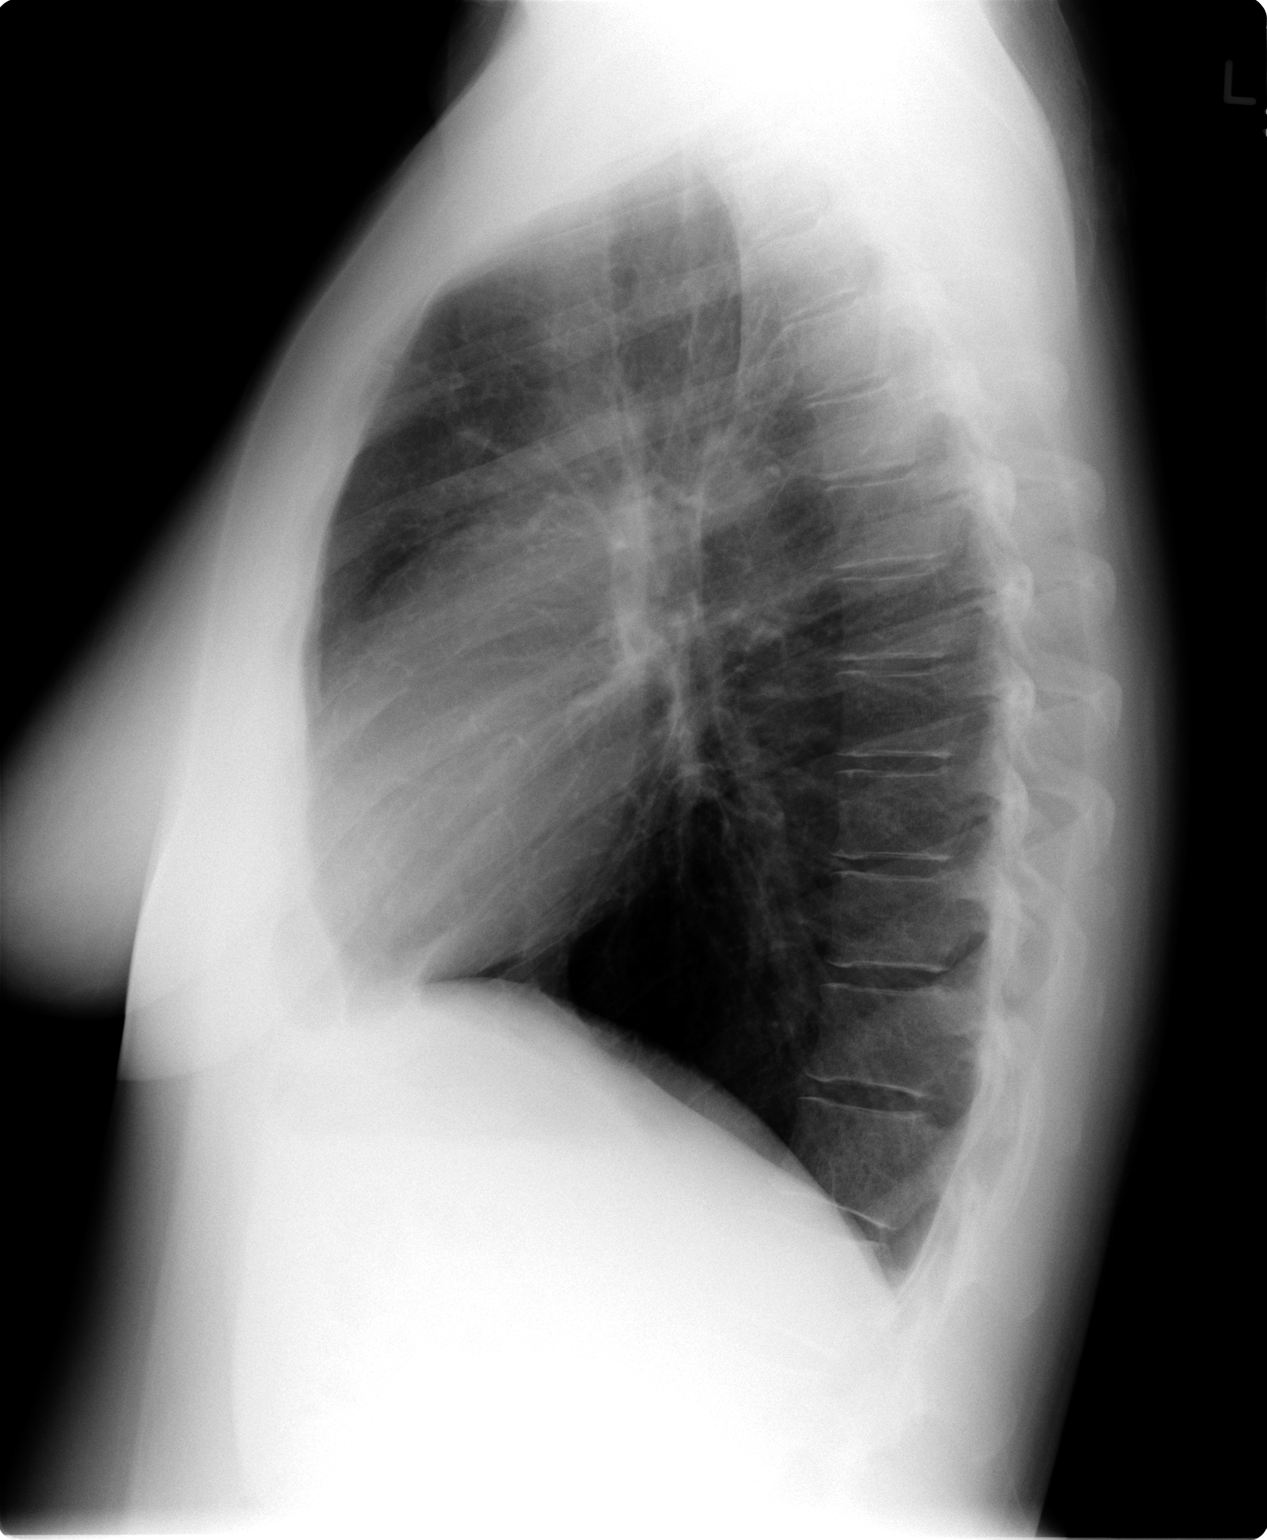

[2 of 2 positions shown; findings below may reference images not displayed]

FINDINGS: The heart size and mediastinal contours are normal.  The
lungs are mildly hyperinflated but clear.  There is no pleural
effusion or pneumothorax.  Osseous structures appear normal.
IMPRESSION: Mild pulmonary hyperinflation suggesting asthma/reactive airways
disease.  No evidence of pneumonia.

## 2013-06-06 ENCOUNTER — Ambulatory Visit (INDEPENDENT_AMBULATORY_CARE_PROVIDER_SITE_OTHER): Payer: BC Managed Care – PPO | Admitting: Family Medicine

## 2013-06-06 ENCOUNTER — Encounter: Payer: Self-pay | Admitting: Family Medicine

## 2013-06-06 VITALS — BP 135/94 | HR 93 | Wt 152.0 lb

## 2013-06-06 DIAGNOSIS — F3289 Other specified depressive episodes: Secondary | ICD-10-CM

## 2013-06-06 DIAGNOSIS — I10 Essential (primary) hypertension: Secondary | ICD-10-CM | POA: Insufficient documentation

## 2013-06-06 DIAGNOSIS — J45909 Unspecified asthma, uncomplicated: Secondary | ICD-10-CM

## 2013-06-06 DIAGNOSIS — F411 Generalized anxiety disorder: Secondary | ICD-10-CM

## 2013-06-06 DIAGNOSIS — F419 Anxiety disorder, unspecified: Secondary | ICD-10-CM

## 2013-06-06 DIAGNOSIS — F329 Major depressive disorder, single episode, unspecified: Secondary | ICD-10-CM

## 2013-06-06 DIAGNOSIS — F32A Depression, unspecified: Secondary | ICD-10-CM

## 2013-06-06 MED ORDER — LISINOPRIL 20 MG PO TABS: 20.0000 mg | ORAL_TABLET | Freq: Every day | ORAL | Status: DC

## 2013-06-06 MED ORDER — ESCITALOPRAM OXALATE 5 MG PO TABS: 5.0000 mg | ORAL_TABLET | Freq: Every day | ORAL | Status: DC

## 2013-06-06 MED ORDER — ALBUTEROL SULFATE HFA 108 (90 BASE) MCG/ACT IN AERS: 2.0000 | INHALATION_SPRAY | Freq: Four times a day (QID) | RESPIRATORY_TRACT | Status: DC | PRN

## 2013-06-06 NOTE — Progress Notes (Signed)
CC: Janet Harrison is a 38 y.o. female is here for Hypertension   Subjective: HPI:  Followup essential hypertension: Since I saw her last she has stopped taking lisinopril due to running out of insurance. No outside blood pressures to report. Soon after stopping the medication she experienced intermittent headaches described as mild in severity, pulsatile, involving the entire head, not accompanied by any motor or sensory disturbances. Also reports greater awareness of her heart rate but denies rapid heartbeat instead described as more forceful. Denies shortness of breath, wheezing, motor or sensory disturbances nor edema.  Followup anxiety: She also stopped Wellbutrin and Lexapro due to running out of insurance. Over the past month she has had increasing difficulty with sleeping due to worrying. She also experiences subjective anxiety that is mild to moderate in severity during the day. Symptoms are worse when confronting parental issues with her son who is no longer living at home. She denies depression or mental disturbance other than above  She's requesting refills on albuterol with no current cough, shortness of breath or wheezing  Review Of Systems Outlined In HPI  Past Medical History  Diagnosis Date  . PONV (postoperative nausea and vomiting)   . Headache(784.0)     migraines  . Depression   . Asthma     prn inhalers  . ADD (attention deficit disorder)   . Breast lump 08/2012    right    Past Surgical History  Procedure Laterality Date  . Tonsillectomy and adenoidectomy      as a child  . Leep    . Tubal ligation  2003, 2005  . Cyst excision Right as a teenager    wrist  . Endometrial ablation    . Breast lumpectomy with needle localization Right 09/20/2012    Procedure: BREAST LUMPECTOMY WITH NEEDLE LOCALIZATION;  Surgeon: Marcello Moores A. Cornett, MD;  Location: [facility];  Service: General;  Laterality: Right;   Family History  Problem Relation Age of Onset  .  Lung cancer Maternal Aunt   . Breast cancer Maternal Aunt   . Breast cancer Maternal Grandmother   . Lung cancer Maternal Grandfather   . Breast cancer Maternal Aunt   . Ovarian cancer Maternal Aunt   . Ovarian cancer Maternal Aunt     History   Social History  . Marital Status: Legally Separated    Spouse Name: N/A    Number of Children: N/A  . Years of Education: N/A   Occupational History  . Not on file.   Social History Main Topics  . Smoking status: Current Every Day Smoker -- 23 years    Types: Cigarettes  . Smokeless tobacco: Never Used     Comment: 6-7 cig./day  . Alcohol Use: 1.2 oz/week    2 Glasses of wine per week     Comment: 2 glasses wine/week  . Drug Use: No  . Sexual Activity: Not on file   Other Topics Concern  . Not on file   Social History Narrative  . No narrative on file     Objective: BP 135/94  Pulse 93  Wt 152 lb (68.947 kg)  General: Alert and Oriented, No Acute Distress HEENT: Pupils equal, round, reactive to light. Conjunctivae clear.  Moist mucous membranes Lungs: Clear to auscultation bilaterally, no wheezing/ronchi/rales.  Comfortable work of breathing. Good air movement. Cardiac: Regular rate and rhythm. Normal S1/S2.  No murmurs, rubs, nor gallops.   Extremities: No peripheral edema.  Strong peripheral pulses.  Mental  Status: No depression nor agitation however mild anxiety Skin: Warm and dry.  Assessment & Plan: Eldora was seen today for hypertension.  Diagnoses and associated orders for this visit:  Essential hypertension, benign - lisinopril (PRINIVIL,ZESTRIL) 20 MG tablet; Take 1 tablet (20 mg total) by mouth daily.  Anxiety - escitalopram (LEXAPRO) 5 MG tablet; Take 1 tablet (5 mg total) by mouth daily.  Depression - escitalopram (LEXAPRO) 5 MG tablet; Take 1 tablet (5 mg total) by mouth daily.  Asthma, chronic - albuterol (PROVENTIL HFA;VENTOLIN HFA) 108 (90 BASE) MCG/ACT inhaler; Inhale 2 puffs into the lungs  every 6 (six) hours as needed for wheezing.     asthma: Controlled continue as needed albuterol Depression: Controlled however anxiety seems to be worsened, therefore start Lexapro and will titrate up as needed Essential hypertension: Controlled restart lisinopril    Return in about 4 weeks (around 07/04/2013).

## 2013-06-30 ENCOUNTER — Ambulatory Visit: Payer: BC Managed Care – PPO | Admitting: Family Medicine

## 2013-07-03 ENCOUNTER — Ambulatory Visit: Payer: BC Managed Care – PPO | Admitting: Family Medicine

## 2013-07-03 DIAGNOSIS — Z0289 Encounter for other administrative examinations: Secondary | ICD-10-CM

## 2013-07-14 ENCOUNTER — Ambulatory Visit (INDEPENDENT_AMBULATORY_CARE_PROVIDER_SITE_OTHER): Payer: BC Managed Care – PPO | Admitting: Family Medicine

## 2013-07-14 ENCOUNTER — Encounter: Payer: Self-pay | Admitting: Family Medicine

## 2013-07-14 VITALS — BP 152/101 | HR 76 | Temp 98.6°F | Wt 154.0 lb

## 2013-07-14 DIAGNOSIS — B009 Herpesviral infection, unspecified: Secondary | ICD-10-CM

## 2013-07-14 DIAGNOSIS — J45909 Unspecified asthma, uncomplicated: Secondary | ICD-10-CM

## 2013-07-14 DIAGNOSIS — B001 Herpesviral vesicular dermatitis: Secondary | ICD-10-CM

## 2013-07-14 DIAGNOSIS — F3289 Other specified depressive episodes: Secondary | ICD-10-CM

## 2013-07-14 DIAGNOSIS — I10 Essential (primary) hypertension: Secondary | ICD-10-CM

## 2013-07-14 DIAGNOSIS — F32A Depression, unspecified: Secondary | ICD-10-CM

## 2013-07-14 DIAGNOSIS — J4530 Mild persistent asthma, uncomplicated: Secondary | ICD-10-CM

## 2013-07-14 DIAGNOSIS — N643 Galactorrhea not associated with childbirth: Secondary | ICD-10-CM | POA: Insufficient documentation

## 2013-07-14 DIAGNOSIS — F329 Major depressive disorder, single episode, unspecified: Secondary | ICD-10-CM

## 2013-07-14 DIAGNOSIS — F988 Other specified behavioral and emotional disorders with onset usually occurring in childhood and adolescence: Secondary | ICD-10-CM

## 2013-07-14 MED ORDER — ALBUTEROL SULFATE HFA 108 (90 BASE) MCG/ACT IN AERS: 2.0000 | INHALATION_SPRAY | Freq: Four times a day (QID) | RESPIRATORY_TRACT | Status: AC | PRN

## 2013-07-14 MED ORDER — AMPHETAMINE-DEXTROAMPHETAMINE 10 MG PO TABS: 10.0000 mg | ORAL_TABLET | Freq: Two times a day (BID) | ORAL | Status: DC

## 2013-07-14 MED ORDER — ARIPIPRAZOLE 2 MG PO TABS: 2.0000 mg | ORAL_TABLET | Freq: Every day | ORAL | Status: DC

## 2013-07-14 MED ORDER — ALBUTEROL SULFATE HFA 108 (90 BASE) MCG/ACT IN AERS: 2.0000 | INHALATION_SPRAY | Freq: Four times a day (QID) | RESPIRATORY_TRACT | Status: DC | PRN

## 2013-07-14 MED ORDER — LISINOPRIL 30 MG PO TABS: 30.0000 mg | ORAL_TABLET | Freq: Every day | ORAL | Status: DC

## 2013-07-14 MED ORDER — VALACYCLOVIR HCL 500 MG PO TABS: ORAL_TABLET | ORAL | Status: AC

## 2013-07-14 NOTE — Progress Notes (Signed)
CC: Janet Harrison is a 38 y.o. female is here for Hypertension and left breast discharge   Subjective: HPI:  Followup essential hypertension: Continues to take lisinopril 20 mg on a daily basis. No outside blood pressures to report. Denies cough, shortness of breath, chest pain, motor sensory disturbances nor   orthopnea  Followup ADD: Continues to take Adderall less than most days of the week. Taking this only when working. States that it continues to improve for difficulty with distractibility and concentration. Denies appetite suppression, increasing anxiety, nor difficulty falling or staying asleep.  Followup depression: Since starting Lexapro she's had much increased stress in her life with a recent expulsion of her son from a local high school. He's also been involved in some legal issues and is now on probation. Since this issue began she has had increasing lack of motivation to get out of bed in the morning, subjective depression, and a poor outlook on life with no thoughts of wanting to harm self or others. Denies any other mental disturbance  Reports that over the past month on an almost daily basis she has a milky discharge from her left nipple. It is painless without blood. She denies any breast architectural changes or skin changes in either breast. She is not taking any over-the-counter supplements. She has never had this before. She denies blood in the discharge, breast pain, nor any nipple stimulation recently.  Complains of a painful blister on her lower lip. She's had reoccurring blisters ever since the stress of her sons recent trouble. She denies skin changes elsewhere nor any genital lesions. No interventions as of yet. She has taken Valtrex in the past with satisfactory response to similar lesions.  Review Of Systems Outlined In HPI  Past Medical History  Diagnosis Date  . PONV (postoperative nausea and vomiting)   . Headache(784.0)     migraines  . Depression   . Asthma      prn inhalers  . ADD (attention deficit disorder)   . Breast lump 08/2012    right    Past Surgical History  Procedure Laterality Date  . Tonsillectomy and adenoidectomy      as a child  . Leep    . Tubal ligation  2003, 2005  . Cyst excision Right as a teenager    wrist  . Endometrial ablation    . Breast lumpectomy with needle localization Right 09/20/2012    Procedure: BREAST LUMPECTOMY WITH NEEDLE LOCALIZATION;  Surgeon: Marcello Moores A. Cornett, MD;  Location: [facility];  Service: General;  Laterality: Right;   Family History  Problem Relation Age of Onset  . Lung cancer Maternal Aunt   . Breast cancer Maternal Aunt   . Breast cancer Maternal Grandmother   . Lung cancer Maternal Grandfather   . Breast cancer Maternal Aunt   . Ovarian cancer Maternal Aunt   . Ovarian cancer Maternal Aunt     History   Social History  . Marital Status: Legally Separated    Spouse Name: N/A    Number of Children: N/A  . Years of Education: N/A   Occupational History  . Not on file.   Social History Main Topics  . Smoking status: Current Every Day Smoker -- 23 years    Types: Cigarettes  . Smokeless tobacco: Never Used     Comment: 6-7 cig./day  . Alcohol Use: 1.2 oz/week    2 Glasses of wine per week     Comment: 2 glasses wine/week  .  Drug Use: No  . Sexual Activity: Not on file   Other Topics Concern  . Not on file   Social History Narrative  . No narrative on file     Objective: BP 152/101  Pulse 76  Temp(Src) 98.6 F (37 C) (Oral)  Wt 154 lb (69.854 kg)  General: Alert and Oriented, No Acute Distress HEENT: Pupils equal, round, reactive to light. Conjunctivae clear.    Moist mucous membranes, pharynx without inflammation nor lesions.  Neck supple without palpable lymphadenopathy nor abnormal masses. 2-3 mm shallow ulceration on the lower lip erythematous and tender to the touch Lungs: Clear to auscultation bilaterally, no wheezing/ronchi/rales.   Comfortable work of breathing. Good air movement. Cardiac: Regular rate and rhythm. Normal S1/S2.  No murmurs, rubs, nor gallops.   Extremities: No peripheral edema.  Strong peripheral pulses.  Mental Status:  Mild depression and tearfulness. No anxiety, nor agitation. Skin: Warm and dry.  Assessment & Plan: Janet Harrison was seen today for hypertension and left breast discharge.  Diagnoses and associated orders for this visit:  Essential hypertension, benign - lisinopril (PRINIVIL,ZESTRIL) 30 MG tablet; Take 1 tablet (30 mg total) by mouth daily.  ADD (attention deficit disorder) - amphetamine-dextroamphetamine (ADDERALL) 10 MG tablet; Take 1 tablet (10 mg total) by mouth 2 (two) times daily.  Depression - ARIPiprazole (ABILIFY) 2 MG tablet; Take 1 tablet (2 mg total) by mouth daily.  Asthma, chronic, mild persistent, uncomplicated - Discontinue: albuterol (PROVENTIL HFA;VENTOLIN HFA) 108 (90 BASE) MCG/ACT inhaler; Inhale 2 puffs into the lungs every 6 (six) hours as needed for wheezing. - albuterol (PROVENTIL HFA;VENTOLIN HFA) 108 (90 BASE) MCG/ACT inhaler; Inhale 2 puffs into the lungs every 6 (six) hours as needed for wheezing.  Galactorrhea - Prolactin - TSH - MM Digital Diagnostic Bilat; Future  Fever blister  Other Orders - valACYclovir (VALTREX) 500 MG tablet; One by mouth twice a day for three days during outbreaks.  Start within 24 hours of symptom onset.   Essential hypertension: Controlled increasing lisinopril   ADD: Controlled continue Adderall as needed Depression: Uncontrolled chronic condition continue Lexapro adding Abilify Asthma: Requesting refill on albuterol Galactorrhea: Checking TSH and prolactin, diagnostic mammogram will be ordered through the [facility] system per her request understandably since they have comparison images are ready and hand Fever blister: Restart Valtrex as needed   Return in about 4 weeks (around 08/11/2013).

## 2013-07-15 LAB — PROLACTIN: Prolactin: 9.9 ng/mL

## 2013-07-15 LAB — TSH: TSH: 0.732 u[IU]/mL (ref 0.350–4.500)

## 2013-07-17 ENCOUNTER — Telehealth: Payer: Self-pay | Admitting: Family Medicine

## 2013-07-17 DIAGNOSIS — N6452 Nipple discharge: Secondary | ICD-10-CM

## 2013-07-17 NOTE — Telephone Encounter (Signed)
Pt.notified

## 2013-07-17 NOTE — Telephone Encounter (Signed)
Janet Harrison, Will you please let patient know that her labs were normal.  Along with ordering a mammogram I'd recommend she meet with a GYN specialist to discuss her discharge.  A referral has been placed.

## 2013-07-18 ENCOUNTER — Telehealth: Payer: Self-pay | Admitting: Family Medicine

## 2013-07-18 DIAGNOSIS — N6452 Nipple discharge: Secondary | ICD-10-CM

## 2013-07-18 NOTE — Telephone Encounter (Signed)
Re: Order for Digital Diagnostic Bilateral I called to schedule an appt with with [place] and was told by scheduler that patient may need an Ultrasound and to include this with Order. Thank you

## 2013-07-19 NOTE — Telephone Encounter (Signed)
Ok put order in [employer]

## 2013-07-19 NOTE — Telephone Encounter (Signed)
Janet Harrison, Order in your inbox

## 2013-07-25 ENCOUNTER — Telehealth: Payer: Self-pay | Admitting: *Deleted

## 2013-07-25 DIAGNOSIS — F32A Depression, unspecified: Secondary | ICD-10-CM

## 2013-07-25 DIAGNOSIS — F329 Major depressive disorder, single episode, unspecified: Secondary | ICD-10-CM

## 2013-07-25 NOTE — Telephone Encounter (Signed)
Pt called and states the lexapro and abilify do not seem to be effective.Pt wants to know if she can be switched to another medication. Also she wants Dr. Ileene Rubens to know that the adderall seems to be causing her to have headaches so she has stopped taking it.

## 2013-07-26 MED ORDER — ARIPIPRAZOLE 5 MG PO TABS: 5.0000 mg | ORAL_TABLET | Freq: Every day | ORAL | Status: DC

## 2013-07-26 NOTE — Telephone Encounter (Signed)
Called and left message on pt's vm with results

## 2013-07-26 NOTE — Telephone Encounter (Signed)
My recommendation would be to try a higher dose of Abilify with current lexapro dose since the beginning abilify dose was very low.  New 5mg  Rx sent to [facility].

## 2013-08-01 ENCOUNTER — Telehealth: Payer: Self-pay

## 2013-08-01 NOTE — Telephone Encounter (Signed)
Spoke with patient about being referred to out office from Dr. Ileene Rubens. Patient have appointment on July 14 with for Mammo and will call back to our office if she still need to see [place] for a follow up.

## 2013-08-03 ENCOUNTER — Ambulatory Visit: Payer: BC Managed Care – PPO | Admitting: Family Medicine

## 2013-08-03 DIAGNOSIS — Z0289 Encounter for other administrative examinations: Secondary | ICD-10-CM

## 2013-08-14 ENCOUNTER — Telehealth: Payer: Self-pay | Admitting: *Deleted

## 2013-08-14 DIAGNOSIS — F988 Other specified behavioral and emotional disorders with onset usually occurring in childhood and adolescence: Secondary | ICD-10-CM

## 2013-08-14 MED ORDER — AMPHETAMINE-DEXTROAMPHETAMINE 10 MG PO TABS: 10.0000 mg | ORAL_TABLET | Freq: Two times a day (BID) | ORAL | Status: DC

## 2013-08-14 NOTE — Telephone Encounter (Signed)
Patient notified. Margette Fast, CMA

## 2013-08-14 NOTE — Telephone Encounter (Signed)
Janet Harrison, Rx placed in in-box ready for pickup/faxing.

## 2013-08-14 NOTE — Telephone Encounter (Signed)
Janet Harrison lmom that she would like to go back on Adderall and she would need a new script for this. She said in the message that she stopped the abilify. She said she knew she was here last month to see you but if you want her to come in for another appt she would. She said that she needed to do something. Please advise. Margette Fast, CMA

## 2013-08-17 ENCOUNTER — Encounter: Payer: BC Managed Care – PPO | Admitting: Family Medicine

## 2013-08-23 ENCOUNTER — Ambulatory Visit (INDEPENDENT_AMBULATORY_CARE_PROVIDER_SITE_OTHER): Payer: BC Managed Care – PPO | Admitting: Family Medicine

## 2013-08-23 ENCOUNTER — Encounter: Payer: Self-pay | Admitting: Family Medicine

## 2013-08-23 VITALS — BP 135/90 | HR 87 | Wt 147.0 lb

## 2013-08-23 DIAGNOSIS — E785 Hyperlipidemia, unspecified: Secondary | ICD-10-CM | POA: Diagnosis not present

## 2013-08-23 DIAGNOSIS — Z0000 Encounter for general adult medical examination without abnormal findings: Secondary | ICD-10-CM

## 2013-08-23 DIAGNOSIS — F988 Other specified behavioral and emotional disorders with onset usually occurring in childhood and adolescence: Secondary | ICD-10-CM

## 2013-08-23 DIAGNOSIS — N63 Unspecified lump in unspecified breast: Secondary | ICD-10-CM

## 2013-08-23 DIAGNOSIS — I10 Essential (primary) hypertension: Secondary | ICD-10-CM | POA: Diagnosis not present

## 2013-08-23 DIAGNOSIS — N6310 Unspecified lump in the right breast, unspecified quadrant: Secondary | ICD-10-CM

## 2013-08-23 DIAGNOSIS — L819 Disorder of pigmentation, unspecified: Secondary | ICD-10-CM

## 2013-08-23 MED ORDER — LISDEXAMFETAMINE DIMESYLATE 20 MG PO CAPS: 20.0000 mg | ORAL_CAPSULE | Freq: Every day | ORAL | Status: DC

## 2013-08-23 NOTE — Patient Instructions (Signed)
Dr. Isaiyah Feldhaus's General Advice Following Your Complete Physical Exam  The Benefits of Regular Exercise: Unless you suffer from an uncontrolled cardiovascular condition, studies strongly suggest that regular exercise and physical activity will add to both the quality and length of your life.  The World Health Organization recommends 150 minutes of moderate intensity aerobic activity every week.  This is best split over 3-4 days a week, and can be as simple as a brisk walk for just over 35 minutes "most days of the week".  This type of exercise has been shown to lower LDL-Cholesterol, lower average blood sugars, lower blood pressure, lower cardiovascular disease risk, improve memory, and increase one's overall sense of wellbeing.  The addition of anaerobic (or "strength training") exercises offers additional benefits including but not limited to increased metabolism, prevention of osteoporosis, and improved overall cholesterol levels.  How Can I Strive For A Low-Fat Diet?: Current guidelines recommend that 25-35 percent of your daily energy (food) intake should come from fats.  One might ask how can this be achieved without having to dissect each meal on a daily basis?  Switch to skim or 1% milk instead of whole milk.  Focus on lean meats such as ground turkey, fresh fish, baked chicken, and lean cuts of beef as your source of dietary protein.  Limit saturated fat consumption to less than 10% of your daily caloric intake.  Limit trans fatty acid consumption primarily by limiting synthetic trans fats such as partially hydrogenated oils (Ex: fried fast foods).  Substitute olive or vegetable oil for solid fats where possible.  Moderation of Salt Intake: Provided you don't carry a diagnosis of congestive heart failure nor renal failure, I recommend a daily allowance of no more than 2300 mg of salt (sodium).  Keeping under this daily goal is associated with a decreased risk of cardiovascular events, creeping  above it can lead to elevated blood pressures and increases your risk of cardiovascular events.  Milligrams (mg) of salt is listed on all nutrition labels, and your daily intake can add up faster than you think.  Most canned and frozen dinners can pack in over half your daily salt allowance in one meal.    Lifestyle Health Risks: Certain lifestyle choices carry specific health risks.  As you may already know, tobacco use has been associated with increasing one's risk of cardiovascular disease, pulmonary disease, numerous cancers, among many other issues.  What you may not know is that there are medications and nicotine replacement strategies that can more than double your chances of successfully quitting.  I would be thrilled to help manage your quitting strategy if you currently use tobacco products.  When it comes to alcohol use, I've yet to find an "ideal" daily allowance.  Provided an individual does not have a medical condition that is exacerbated by alcohol consumption, general guidelines determine "safe drinking" as no more than two standard drinks for a man or no more than one standard drink for a female per day.  However, much debate still exists on whether any amount of alcohol consumption is technically "safe".  My general advice, keep alcohol consumption to a minimum for general health promotion.  If you or others believe that alcohol, tobacco, or recreational drug use is interfering with your life, I would be happy to provide confidential counseling regarding treatment options.  General "Over The Counter" Nutrition Advice: Postmenopausal women should aim for a daily calcium intake of 1200 mg, however a significant portion of this might already be   provided by diets including milk, yogurt, cheese, and other dairy products.  Vitamin D has been shown to help preserve bone density, prevent fatigue, and has even been shown to help reduce falls in the elderly.  Ensuring a daily intake of 800 Units of  Vitamin D is a good place to start to enjoy the above benefits, we can easily check your Vitamin D level to see if you'd potentially benefit from supplementation beyond 800 Units a day.  Folic Acid intake should be of particular concern to women of childbearing age.  Daily consumption of 400-800 mcg of Folic Acid is recommended to minimize the chance of spinal cord defects in a fetus should pregnancy occur.    For many adults, accidents still remain one of the most common culprits when it comes to cause of death.  Some of the simplest but most effective preventitive habits you can adopt include regular seatbelt use, proper helmet use, securing firearms, and regularly testing your smoke and carbon monoxide detectors.  Neftali Thurow B. Kailene Steinhart DO [facility] [address], [address] [place], [place] [address] Phone: [phone]  

## 2013-08-23 NOTE — Progress Notes (Signed)
CC: Janet Harrison is a 38 y.o. female is here for Annual Exam   Subjective: HPI:  Colonoscopy: Aunt had colon cancer at 36, will start around 38 years of age. Papsmear: Had LEEP 2014, rec annual but she politely declines today Mammogram: Due for repeat mammo of right breast, order has been placed, she is shuffling around her schedule to try to get into [facility] as soon as possible, she is no longer experiencing any breast discharge encouraged her to have this mammogram is in his possible  Influenza Vaccine: Encouraged her to have this with our office as soon as flu season arrives Pneumovax: Pneumovax 2014 Td/Tdap: Tdap UTD 2014 Zoster: (Start 38 yo)  Her only complaint today is in regards to her headache that occurs every time she takes Adderall therefore she has stopped taking his medication. Since stopping she has had extreme difficulty at work with concentrating and completing multitasking.  Rare alcohol use no tobacco or recreational drug use  Review of Systems - General ROS: negative for - chills, fever, night sweats, weight gain or weight loss Ophthalmic ROS: negative for - decreased vision Psychological ROS: negative for - anxiety or depression ENT ROS: negative for - hearing change, nasal congestion, tinnitus or allergies Hematological and Lymphatic ROS: negative for - bleeding problems, bruising or swollen lymph nodes Breast ROS: negative Respiratory ROS: no cough, shortness of breath, or wheezing Cardiovascular ROS: no chest pain or dyspnea on exertion Gastrointestinal ROS: no abdominal pain, change in bowel habits, or black or bloody stools Genito-Urinary ROS: negative for - genital discharge, genital ulcers, incontinence or abnormal bleeding from genitals Musculoskeletal ROS: negative for - joint pain or muscle pain Neurological ROS: negative for - headaches or memory loss Dermatological ROS: negative for lumps, rash and skin lesion changes . Positive for a mole on her back  which she believes is enlarging and changing color   Past Medical History  Diagnosis Date  . PONV (postoperative nausea and vomiting)   . Headache(784.0)     migraines  . Depression   . Asthma     prn inhalers  . ADD (attention deficit disorder)   . Breast lump 08/2012    right    Past Surgical History  Procedure Laterality Date  . Tonsillectomy and adenoidectomy      as a child  . Leep    . Tubal ligation  2003, 2005  . Cyst excision Right as a teenager    wrist  . Endometrial ablation    . Breast lumpectomy with needle localization Right 09/20/2012    Procedure: BREAST LUMPECTOMY WITH NEEDLE LOCALIZATION;  Surgeon: Marcello Moores A. Cornett, MD;  Location: [place];  Service: General;  Laterality: Right;   Family History  Problem Relation Age of Onset  . Lung cancer Maternal Aunt   . Breast cancer Maternal Aunt   . Breast cancer Maternal Grandmother   . Lung cancer Maternal Grandfather   . Breast cancer Maternal Aunt   . Ovarian cancer Maternal Aunt   . Ovarian cancer Maternal Aunt     History   Social History  . Marital Status: Legally Separated    Spouse Name: N/A    Number of Children: N/A  . Years of Education: N/A   Occupational History  . Not on file.   Social History Main Topics  . Smoking status: Current Every Day Smoker -- 23 years    Types: Cigarettes  . Smokeless tobacco: Never Used     Comment: 6-7 cig./day  .  Alcohol Use: 1.2 oz/week    2 Glasses of wine per week     Comment: 2 glasses wine/week  . Drug Use: No  . Sexual Activity: Not on file   Other Topics Concern  . Not on file   Social History Narrative  . No narrative on file     Objective: BP 135/90  Pulse 87  Wt 147 lb (66.679 kg)  General: No Acute Distress HEENT: Atraumatic, normocephalic, conjunctivae normal without scleral icterus.  No nasal discharge, hearing grossly intact, TMs with good landmarks bilaterally with no middle ear abnormalities, posterior pharynx  clear without oral lesions. Neck: Supple, trachea midline, no cervical nor supraclavicular adenopathy. Pulmonary: Clear to auscultation bilaterally without wheezing, rhonchi, nor rales. Cardiac: Regular rate and rhythm.  No murmurs, rubs, nor gallops. No peripheral edema.  2+ peripheral pulses bilaterally. Abdomen: Bowel sounds normal.  No masses.  Non-tender without rebound.  Negative Murphy's sign. GU: Declined by patient MSK: Grossly intact, no signs of weakness.  Full strength throughout upper and lower extremities.  Full ROM in upper and lower extremities.  No midline spinal tenderness. Neuro: Gait unremarkable, CN II-XII grossly intact.  C5-C6 Reflex 2/4 Bilaterally, L4 Reflex 2/4 Bilaterally.  Cerebellar function intact. Skin: No rashes. Pigmented lesion on the back between the shoulder blades with asymmetry, color varying color and about the size of 5 mm in diameter Psych: Alert and oriented to person/place/time.  Thought process normal. No anxiety/depression.   Assessment & Plan: Janet Harrison was seen today for annual exam.  Diagnoses and associated orders for this visit:  Annual physical exam - Lipid panel - BASIC METABOLIC PANEL WITH GFR  Breast mass, right  Essential hypertension, benign - BASIC METABOLIC PANEL WITH GFR  Hyperlipidemia - Lipid panel  ADD (attention deficit disorder) - lisdexamfetamine (VYVANSE) 20 MG capsule; Take 1 capsule (20 mg total) by mouth daily.  Pigmented skin lesion    Healthy lifestyle interventions including but not limited to regular exercise, a healthy low fat diet, moderation of salt intake, the dangers of tobacco/alcohol/recreational drug use, nutrition supplementation, and accident avoidance were discussed with the patient and a handout was provided for future reference. Due for repeat lipid panel and checking renal function due to hypertension and lisinopril use ADD: Uncontrolled stopping Adderall and switching to Vyvanse I've encouraged  her to return at her convenience to have a biopsy performed on the pigmented skin lesion between her shoulder blades to rule out melanoma versus premalignant lesion  Return for 1-2 months for 30 minute biopsy visit.

## 2013-09-06 ENCOUNTER — Telehealth: Payer: Self-pay | Admitting: *Deleted

## 2013-09-06 MED ORDER — AMPHETAMINE-DEXTROAMPHET ER 20 MG PO CP24
20.0000 mg | ORAL_CAPSULE | ORAL | Status: DC
Start: 2013-09-06 — End: 2013-10-06

## 2013-09-06 NOTE — Telephone Encounter (Signed)
Message left on vm 

## 2013-09-06 NOTE — Telephone Encounter (Signed)
Pt called and left a message that the vyvanse is really suppressing her appetite and it's too expensive for her. Her co-pay is $100. Pt wanted to know what other options she has

## 2013-09-06 NOTE — Telephone Encounter (Signed)
Adderall XR, [place] acting, is next option. Seth Bake, Rx placed in in-box ready for pickup/faxing.

## 2013-10-06 ENCOUNTER — Telehealth: Payer: Self-pay | Admitting: *Deleted

## 2013-10-06 MED ORDER — AMPHETAMINE-DEXTROAMPHET ER 20 MG PO CP24: 20.0000 mg | ORAL_CAPSULE | ORAL | Status: DC

## 2013-10-06 NOTE — Telephone Encounter (Signed)
Pt request a rx for that adderall xr. She states it has been working great for her

## 2013-10-20 ENCOUNTER — Ambulatory Visit (INDEPENDENT_AMBULATORY_CARE_PROVIDER_SITE_OTHER): Payer: BC Managed Care – PPO | Admitting: Family Medicine

## 2013-10-20 ENCOUNTER — Encounter: Payer: Self-pay | Admitting: Family Medicine

## 2013-10-20 ENCOUNTER — Other Ambulatory Visit: Payer: Self-pay | Admitting: Family Medicine

## 2013-10-20 VITALS — BP 160/108 | HR 94 | Wt 150.0 lb

## 2013-10-20 DIAGNOSIS — F988 Other specified behavioral and emotional disorders with onset usually occurring in childhood and adolescence: Secondary | ICD-10-CM | POA: Diagnosis not present

## 2013-10-20 DIAGNOSIS — G8929 Other chronic pain: Secondary | ICD-10-CM

## 2013-10-20 DIAGNOSIS — F32A Depression, unspecified: Secondary | ICD-10-CM

## 2013-10-20 DIAGNOSIS — F329 Major depressive disorder, single episode, unspecified: Secondary | ICD-10-CM | POA: Diagnosis not present

## 2013-10-20 DIAGNOSIS — R51 Headache: Secondary | ICD-10-CM | POA: Diagnosis not present

## 2013-10-20 DIAGNOSIS — L819 Disorder of pigmentation, unspecified: Secondary | ICD-10-CM | POA: Diagnosis not present

## 2013-10-20 DIAGNOSIS — L820 Inflamed seborrheic keratosis: Secondary | ICD-10-CM | POA: Diagnosis not present

## 2013-10-20 DIAGNOSIS — F3289 Other specified depressive episodes: Secondary | ICD-10-CM | POA: Diagnosis not present

## 2013-10-20 MED ORDER — LISDEXAMFETAMINE DIMESYLATE 20 MG PO CAPS: 20.0000 mg | ORAL_CAPSULE | Freq: Every day | ORAL | Status: DC

## 2013-10-20 MED ORDER — TOPIRAMATE 100 MG PO TABS: 100.0000 mg | ORAL_TABLET | Freq: Two times a day (BID) | ORAL | Status: DC

## 2013-10-20 NOTE — Progress Notes (Signed)
CC: Janet Harrison is a 38 y.o. female is here for f/u meds and mole removal   Subjective: HPI:  Followup ADD: Up until last week she was taking Adderall a daily basis however she noticed that it would amplify and lower her headache threshold. It was helping drastically with concentration however side effects were intolerable so she stopped taking it. She believes that Vyvanse had much less side effects and although it was quite expensive she would prefer to go back on that.  Follow depression: She tells me that on the days that she does not have custody of her children she spending more free time in her bed and has lost interest in all hobbies. She reports that her depression seems to be worsening a daily basis but has not had any thoughts wanting to harm herself or others. She denies anxiety or any other mental disturbance other than that described above. He continues to take Lexapro daily basis.  She returns for biopsy of a pigmented lesion on her back that's been between her shoulder blades for matter of years that she thinks is enlarging. It is painless. She also has a spot in her left inguinal area that is pigmented, raised, and severely painful when shaving or wearing tightfitting pants. It has been present for matter of years and seems to be getting bigger and more painful   She's requesting refills on Topamax which she takes for migraine in general headache prevention.   Review Of Systems Outlined In HPI  Past Medical History  Diagnosis Date  . PONV (postoperative nausea and vomiting)   . Headache(784.0)     migraines  . Depression   . Asthma     prn inhalers  . ADD (attention deficit disorder)   . Breast lump 08/2012    right    Past Surgical History  Procedure Laterality Date  . Tonsillectomy and adenoidectomy      as a child  . Leep    . Tubal ligation  2003, 2005  . Cyst excision Right as a teenager    wrist  . Endometrial ablation    . Breast lumpectomy with needle  localization Right 09/20/2012    Procedure: BREAST LUMPECTOMY WITH NEEDLE LOCALIZATION;  Surgeon: Marcello Moores A. Cornett, MD;  Location: [place];  Service: General;  Laterality: Right;   Family History  Problem Relation Age of Onset  . Lung cancer Maternal Aunt   . Breast cancer Maternal Aunt   . Breast cancer Maternal Grandmother   . Lung cancer Maternal Grandfather   . Breast cancer Maternal Aunt   . Ovarian cancer Maternal Aunt   . Ovarian cancer Maternal Aunt     History   Social History  . Marital Status: Legally Separated    Spouse Name: N/A    Number of Children: N/A  . Years of Education: N/A   Occupational History  . Not on file.   Social History Main Topics  . Smoking status: Current Every Day Smoker -- 23 years    Types: Cigarettes  . Smokeless tobacco: Never Used     Comment: 6-7 cig./day  . Alcohol Use: 1.2 oz/week    2 Glasses of wine per week     Comment: 2 glasses wine/week  . Drug Use: No  . Sexual Activity: Not on file   Other Topics Concern  . Not on file   Social History Narrative  . No narrative on file     Objective: BP 160/108  Pulse  94  Wt 150 lb (68.04 kg)  General: Alert and Oriented, No Acute Distress HEENT: Pupils equal, round, reactive to light. Conjunctivae clear.  Moist membranes pharynx unremarkable Lungs: Clear to auscultation bilaterally, no wheezing/ronchi/rales.  Comfortable work of breathing. Good air movement. Cardiac: Regular rate and rhythm. Normal S1/S2.  No murmurs, rubs, nor gallops.   Extremities: No peripheral edema.  Strong peripheral pulses.  Mental Status: No depression, anxiety, nor agitation. Skin: Warm and dry Seth Bake present as chaperone). There is a 5 mm diameter inflamed seborrheic keratosis in the left groin. There is a 5 mm diameter heterogeneously pigmented flat lesion on the back between the shoulder blades.  Assessment & Plan: Dannon was seen today for f/u meds and mole removal.  Diagnoses  and associated orders for this visit:  Pigmented skin lesion - Dermatology pathology  Depression - Ambulatory referral to Psychiatry  Chronic nonintractable headache, unspecified headache type - topiramate (TOPAMAX) 100 MG tablet; Take 1 tablet (100 mg total) by mouth 2 (two) times daily.  ADD (attention deficit disorder) - lisdexamfetamine (VYVANSE) 20 MG capsule; Take 1 capsule (20 mg total) by mouth daily.  Inflamed seborrheic keratosis    Pigmented skin lesion: Successful Punch biopsy to rule out melanoma, awaiting path results Depression: Uncontrolled referred to psychiatry for specialty management Headaches: Control on Topamax, continue ADD: Controlled on Adderall however causing intolerable side effects, switching back to Vyvanse Inflamed seborrheic keratosis: Destructed  Cryotherapy Procedure Note  Pre-operative Diagnosis: inflamed seborrheic keratosis  Post-operative Diagnosis: same  Locations: left groin Indications:pain  Anesthesia:none  Procedure Details  History of allergy to iodine: no. Pacemaker? no.  Patient informed of risks (permanent scarring, infection, light or dark discoloration, bleeding, infection, weakness, numbness and recurrence of the lesion) and benefits of the procedure and verbal informed consent obtained.  The areas are treated with liquid nitrogen therapy, frozen until ice ball extended 2 mm beyond lesion, allowed to thaw, and treated again. The patient tolerated procedure well.  The patient was instructed on post-op care, warned that there may be blister formation, redness and pain. Recommend OTC analgesia as needed for pain.  Condition: Stable  Complications: none.  Plan: 1. Instructed to keep the area dry and covered for 24-48h and clean thereafter. 2. Warning signs of infection were reviewed.   3. Recommended that the patient use OTC analgesics as needed for pain.  4. Return PRN  Punch Biopsy Procedure Note  Pre-operative  Diagnosis: Suspicious lesion  Post-operative Diagnosis: same  Locations:between scapulas  Indications: rule out melanoma  Anesthesia: Lidocaine 1% with epinephrine without added sodium bicarbonate  Procedure Details  History of allergy to iodine: no Patient informed of the risks (including bleeding and infection) and benefits of the  procedure and Verbal informed consent obtained.  The lesion and surrounding area was given a sterile prep using chlorhexidine and draped in the usual sterile fashion. The skin was then stretched perpendicular to the skin tension lines and the lesion removed using the 61mm punch. The resulting ellipse was then closed. The wound was closed with a single steri-strip. sterile dressing applied. The specimen was sent for pathologic examination. The patient tolerated the procedure well.  EBL: 2 ml  Findings: unremarkable  Condition: Stable  Complications: none.  Plan: 1. Instructed to keep the wound dry and covered for 24-48h and clean thereafter. 2. Warning signs of infection were reviewed.   3. Recommended that the patient use OTC analgesics as needed for pain.    Return if symptoms worsen or  fail to improve.

## 2013-10-25 ENCOUNTER — Encounter: Payer: Self-pay | Admitting: Family Medicine

## 2013-11-03 ENCOUNTER — Encounter (HOSPITAL_COMMUNITY): Payer: Self-pay | Admitting: Psychiatry

## 2013-11-03 ENCOUNTER — Encounter (INDEPENDENT_AMBULATORY_CARE_PROVIDER_SITE_OTHER): Payer: Self-pay

## 2013-11-03 ENCOUNTER — Ambulatory Visit (INDEPENDENT_AMBULATORY_CARE_PROVIDER_SITE_OTHER): Payer: BC Managed Care – PPO | Admitting: Psychiatry

## 2013-11-03 VITALS — BP 124/85 | HR 90 | Ht 66.0 in | Wt 140.0 lb

## 2013-11-03 DIAGNOSIS — F4310 Post-traumatic stress disorder, unspecified: Secondary | ICD-10-CM

## 2013-11-03 DIAGNOSIS — F988 Other specified behavioral and emotional disorders with onset usually occurring in childhood and adolescence: Secondary | ICD-10-CM

## 2013-11-03 DIAGNOSIS — F331 Major depressive disorder, recurrent, moderate: Secondary | ICD-10-CM

## 2013-11-03 DIAGNOSIS — F900 Attention-deficit hyperactivity disorder, predominantly inattentive type: Secondary | ICD-10-CM

## 2013-11-03 MED ORDER — BUPROPION HCL 100 MG PO TABS: ORAL_TABLET | ORAL | Status: DC

## 2013-11-03 MED ORDER — LISDEXAMFETAMINE DIMESYLATE 20 MG PO CAPS
20.0000 mg | ORAL_CAPSULE | Freq: Every day | ORAL | Status: DC
Start: 2013-11-03 — End: 2013-12-08
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
## 2013-11-24 ENCOUNTER — Ambulatory Visit (HOSPITAL_COMMUNITY): Payer: Self-pay | Admitting: Psychiatry

## 2013-11-27 ENCOUNTER — Ambulatory Visit (HOSPITAL_COMMUNITY): Payer: Self-pay | Admitting: Psychiatry

## 2013-12-04 ENCOUNTER — Other Ambulatory Visit (HOSPITAL_COMMUNITY): Payer: Self-pay | Admitting: Psychiatry

## 2013-12-08 ENCOUNTER — Ambulatory Visit (INDEPENDENT_AMBULATORY_CARE_PROVIDER_SITE_OTHER): Payer: BC Managed Care – PPO | Admitting: Family Medicine

## 2013-12-08 ENCOUNTER — Encounter: Payer: Self-pay | Admitting: Family Medicine

## 2013-12-08 VITALS — BP 146/101 | HR 83 | Wt 150.0 lb

## 2013-12-08 DIAGNOSIS — L819 Disorder of pigmentation, unspecified: Secondary | ICD-10-CM | POA: Diagnosis not present

## 2013-12-08 DIAGNOSIS — I10 Essential (primary) hypertension: Secondary | ICD-10-CM

## 2013-12-08 DIAGNOSIS — F988 Other specified behavioral and emotional disorders with onset usually occurring in childhood and adolescence: Secondary | ICD-10-CM

## 2013-12-08 DIAGNOSIS — R51 Headache: Secondary | ICD-10-CM

## 2013-12-08 DIAGNOSIS — F909 Attention-deficit hyperactivity disorder, unspecified type: Secondary | ICD-10-CM | POA: Diagnosis not present

## 2013-12-08 DIAGNOSIS — G8929 Other chronic pain: Secondary | ICD-10-CM

## 2013-12-08 DIAGNOSIS — F329 Major depressive disorder, single episode, unspecified: Secondary | ICD-10-CM

## 2013-12-08 DIAGNOSIS — F32A Depression, unspecified: Secondary | ICD-10-CM

## 2013-12-08 MED ORDER — LISINOPRIL-HYDROCHLOROTHIAZIDE 20-25 MG PO TABS: 1.0000 | ORAL_TABLET | Freq: Every day | ORAL | Status: DC

## 2013-12-08 MED ORDER — TOPIRAMATE 100 MG PO TABS: 100.0000 mg | ORAL_TABLET | Freq: Two times a day (BID) | ORAL | Status: AC

## 2013-12-08 MED ORDER — ESCITALOPRAM OXALATE 20 MG PO TABS: 20.0000 mg | ORAL_TABLET | Freq: Every day | ORAL | Status: AC

## 2013-12-08 MED ORDER — LISDEXAMFETAMINE DIMESYLATE 20 MG PO CAPS: 20.0000 mg | ORAL_CAPSULE | Freq: Every day | ORAL | Status: DC

## 2013-12-08 MED ORDER — BUPROPION HCL ER (XL) 300 MG PO TB24: 300.0000 mg | ORAL_TABLET | Freq: Every day | ORAL | Status: DC

## 2013-12-08 NOTE — Progress Notes (Signed)
CC: Janet Harrison is a 38 y.o. female is here for Medication Management   Subjective: HPI:  Follow-up depression: Since increasing Wellbutrin with her psychiatrist she reports significant improvement with outlook on life, no longer reports any depression. Continues on Lexapro, Vyvanse. She denies any depression or anxiety. She's having no difficulty with concentrating at work while taking Vyvanse but she is only taking this most days of the week but not all. She wants to know if I'll manage some of her medications so that she doesn't have to pay $60 co-pay with behavioral health.  Follow-up skin lesion: She denies any discomfort or discharge from the biopsy site on her back nor any new skin conditions.  Follow-up chronic headaches: Continues to take Topamax on a daily basis. She denies any known side effects. She states that this has decreased the frequency severity of her headaches. There's been no change in character of her headaches. She denies any new motor or sensory disturbances  Hypertension: Continues on lisinopril with no outside blood pressures to report. No chest pain shortness of breath nor peripheral edema   Review Of Systems Outlined In HPI  Past Medical History  Diagnosis Date  . PONV (postoperative nausea and vomiting)   . Headache(784.0)     migraines  . Depression   . Asthma     prn inhalers  . ADD (attention deficit disorder)   . Breast lump 08/2012    right    Past Surgical History  Procedure Laterality Date  . Tonsillectomy and adenoidectomy      as a child  . Leep    . Tubal ligation  2003, 2005  . Cyst excision Right as a teenager    wrist  . Endometrial ablation    . Breast lumpectomy with needle localization Right 09/20/2012    Procedure: BREAST LUMPECTOMY WITH NEEDLE LOCALIZATION;  Surgeon: Marcello Moores A. Cornett, MD;  Location: [place];  Service: General;  Laterality: Right;   Family History  Problem Relation Age of Onset  . Lung cancer  Maternal Aunt   . Breast cancer Maternal Aunt   . Breast cancer Maternal Grandmother   . Lung cancer Maternal Grandfather   . Breast cancer Maternal Aunt   . Ovarian cancer Maternal Aunt   . Ovarian cancer Maternal Aunt   . ADD / ADHD Maternal Uncle     History   Social History  . Marital Status: Legally Separated    Spouse Name: N/A    Number of Children: N/A  . Years of Education: N/A   Occupational History  . Not on file.   Social History Main Topics  . Smoking status: Current Every Day Smoker -- 23 years    Types: Cigarettes  . Smokeless tobacco: Never Used     Comment: 6-7 cig./day  . Alcohol Use: 1.2 oz/week    2 Glasses of wine per week     Comment: 2 glasses wine/week  . Drug Use: No  . Sexual Activity: Not Currently   Other Topics Concern  . Not on file   Social History Narrative  . No narrative on file     Objective: BP 146/101 mmHg  Pulse 83  Wt 150 lb (68.04 kg)  General: Alert and Oriented, No Acute Distress HEENT: Pupils equal, round, reactive to light. Conjunctivae clear.  Morris because membranes times unremarkable Lungs: clearing comfortable work of breathing Cardiac: Regular rate and rhythm.  Extremities: No peripheral edema.  Strong peripheral pulses.  Mental Status: No  depression, anxiety, nor agitation. Skin: Warm and dry. Well-healed surgical scar on the back without any pigmentation.  Assessment & Plan: Janet Harrison was seen today for medication management.  Diagnoses and associated orders for this visit:  Essential hypertension, benign - lisinopril-hydrochlorothiazide (PRINZIDE,ZESTORETIC) 20-25 MG per tablet; Take 1 tablet by mouth daily.  Pigmented skin lesion  ADD (attention deficit disorder) - lisdexamfetamine (VYVANSE) 20 MG capsule; Take 1 capsule (20 mg total) by mouth daily.  Chronic nonintractable headache, unspecified headache type - topiramate (TOPAMAX) 100 MG tablet; Take 1 tablet (100 mg total) by mouth 2 (two) times  daily.  Depression  Other Orders - escitalopram (LEXAPRO) 20 MG tablet; Take 1 tablet (20 mg total) by mouth daily. - buPROPion (WELLBUTRIN XL) 300 MG 24 hr tablet; Take 1 tablet (300 mg total) by mouth daily.    Essential hypertension: Uncontrolled chronic condition stop current lisinopril dose switching to lisinopril-hydrochlorothiazide Pigmented skin lesion: No sign of recurrence will follow in the future ADD: Controlled continue Vyvanse Depression: Controlled, continue Wellbutrin and Lexapro. I'm agreeable to help prescribing these prescriptions while her condition is stable but for any decline she understands she'll need to go back and see behavioral health. Headaches: Controlled on Topamax  Return in about 4 weeks (around 01/05/2014) for BP Check.

## 2014-01-12 ENCOUNTER — Other Ambulatory Visit: Payer: Self-pay | Admitting: *Deleted

## 2014-01-12 DIAGNOSIS — F988 Other specified behavioral and emotional disorders with onset usually occurring in childhood and adolescence: Secondary | ICD-10-CM

## 2014-01-12 MED ORDER — LISDEXAMFETAMINE DIMESYLATE 20 MG PO CAPS: 20.0000 mg | ORAL_CAPSULE | Freq: Every day | ORAL | Status: DC

## 2014-01-12 NOTE — Addendum Note (Signed)
Addended by: Marcial Pacas on: 01/12/2014 12:06 PM   Modules accepted: Orders

## 2014-02-12 ENCOUNTER — Other Ambulatory Visit: Payer: Self-pay | Admitting: *Deleted

## 2014-02-12 DIAGNOSIS — F988 Other specified behavioral and emotional disorders with onset usually occurring in childhood and adolescence: Secondary | ICD-10-CM

## 2014-02-12 MED ORDER — LISDEXAMFETAMINE DIMESYLATE 20 MG PO CAPS: 20.0000 mg | ORAL_CAPSULE | Freq: Every day | ORAL | Status: DC

## 2014-04-04 ENCOUNTER — Other Ambulatory Visit: Payer: Self-pay | Admitting: *Deleted

## 2014-04-04 DIAGNOSIS — F988 Other specified behavioral and emotional disorders with onset usually occurring in childhood and adolescence: Secondary | ICD-10-CM

## 2014-04-04 MED ORDER — LISDEXAMFETAMINE DIMESYLATE 20 MG PO CAPS: 20.0000 mg | ORAL_CAPSULE | Freq: Every day | ORAL | Status: DC

## 2014-04-17 ENCOUNTER — Encounter: Payer: Self-pay | Admitting: Family Medicine

## 2014-04-17 ENCOUNTER — Ambulatory Visit (INDEPENDENT_AMBULATORY_CARE_PROVIDER_SITE_OTHER): Payer: BLUE CROSS/BLUE SHIELD | Admitting: Family Medicine

## 2014-04-17 VITALS — BP 165/102 | HR 93 | Wt 163.0 lb

## 2014-04-17 DIAGNOSIS — F32A Depression, unspecified: Secondary | ICD-10-CM

## 2014-04-17 DIAGNOSIS — F909 Attention-deficit hyperactivity disorder, unspecified type: Secondary | ICD-10-CM

## 2014-04-17 DIAGNOSIS — F329 Major depressive disorder, single episode, unspecified: Secondary | ICD-10-CM

## 2014-04-17 DIAGNOSIS — I10 Essential (primary) hypertension: Secondary | ICD-10-CM

## 2014-04-17 DIAGNOSIS — J4530 Mild persistent asthma, uncomplicated: Secondary | ICD-10-CM

## 2014-04-17 DIAGNOSIS — F988 Other specified behavioral and emotional disorders with onset usually occurring in childhood and adolescence: Secondary | ICD-10-CM

## 2014-04-17 MED ORDER — BUPROPION HCL ER (XL) 300 MG PO TB24: 300.0000 mg | ORAL_TABLET | Freq: Every day | ORAL | Status: DC

## 2014-04-17 MED ORDER — LISINOPRIL-HYDROCHLOROTHIAZIDE 20-25 MG PO TABS: 1.0000 | ORAL_TABLET | Freq: Every day | ORAL | Status: AC

## 2014-04-17 MED ORDER — CONCERTA 18 MG PO TBCR: 18.0000 mg | EXTENDED_RELEASE_TABLET | Freq: Every day | ORAL | Status: DC

## 2014-04-17 MED ORDER — BUDESONIDE-FORMOTEROL FUMARATE 80-4.5 MCG/ACT IN AERO: 2.0000 | INHALATION_SPRAY | Freq: Two times a day (BID) | RESPIRATORY_TRACT | Status: AC

## 2014-04-17 MED ORDER — BUPROPION HCL ER (XL) 150 MG PO TB24: 150.0000 mg | ORAL_TABLET | Freq: Every day | ORAL | Status: DC

## 2014-04-17 NOTE — Progress Notes (Signed)
CC: Janet Harrison is a 39 y.o. female is here for Hypertension   Subjective: HPI:  Follow-up essential hypertension: She ran out of lisinopril-hydrochlorothiazide earlier this week. She denies any known intolerance in starting this medication many months ago. No outside blood pressures reported. No chest pain shortness of breath orthopnea nor peripheral edema  Follow-up asthma: She's been using albuterol sparingly needing multiple times a week but no nocturnal symptoms, should like to use it more often but she is trying to conservative due to the cost and $90 per refill. She's not been taking any controller medicines. Symptoms are described as coughing and wheezing. No shortness of breath. Symptoms are worse when outside  Follow-up ADD: Over the past months she's noticed that she's having significant difficulty at work with focus on one task at a time without getting distracted only to have nothing really completed by the end of the day. She's been taking her Vyvanse sparingly because it causes an intolerable headache but does barely improve the above symptoms. Nothing else seems to make symptoms better or worse or moderate in severity on a daily basis  Follow-up depression: She tells me over the last month she's also had difficulty getting out of bed and finding the motivation to get things done at home and at work. She is constantly thinking about the complicated situation with her son not being allowed to be in [place] [facility] with an [facility] and still being on probation. She feels like the world is against her and she's not getting a break in any aspect of her life. She stated about moving the family to [place] so they can start over, no thoughts of [address] others. She sees a Editor, commissioning, meditates and is still spiritual . She felt like things were going well at the end of 2015 after starting Wellbutrin but now it's slowly getting worse again.   Review Of Systems Outlined In  HPI  Past Medical History  Diagnosis Date  . PONV (postoperative nausea and vomiting)   . Headache(784.0)     migraines  . Depression   . Asthma     prn inhalers  . ADD (attention deficit disorder)   . Breast lump 08/2012    right    Past Surgical History  Procedure Laterality Date  . Tonsillectomy and adenoidectomy      as a child  . Leep    . Tubal ligation  2003, 2005  . Cyst excision Right as a teenager    wrist  . Endometrial ablation    . Breast lumpectomy with needle localization Right 09/20/2012    Procedure: BREAST LUMPECTOMY WITH NEEDLE LOCALIZATION;  Surgeon: Marcello Moores A. Cornett, MD;  Location: [place];  Service: General;  Laterality: Right;   Family History  Problem Relation Age of Onset  . Lung cancer Maternal Aunt   . Breast cancer Maternal Aunt   . Breast cancer Maternal Grandmother   . Lung cancer Maternal Grandfather   . Breast cancer Maternal Aunt   . Ovarian cancer Maternal Aunt   . Ovarian cancer Maternal Aunt   . ADD / ADHD Maternal Uncle     History   Social History  . Marital Status: Legally Separated    Spouse Name: N/A  . Number of Children: N/A  . Years of Education: N/A   Occupational History  . Not on file.   Social History Main Topics  . Smoking status: Current Every Day Smoker -- 23 years  Types: Cigarettes  . Smokeless tobacco: Never Used     Comment: 6-7 cig./day  . Alcohol Use: 1.2 oz/week    2 Glasses of wine per week     Comment: 2 glasses wine/week  . Drug Use: No  . Sexual Activity: Not Currently   Other Topics Concern  . Not on file   Social History Narrative     Objective: BP 165/102 mmHg  Pulse 93  Wt 163 lb (73.936 kg)  Vital signs reviewed. General: Alert and Oriented, No Acute Distress HEENT: Pupils equal, round, reactive to light. Conjunctivae clear.  External ears unremarkable.  Moist mucous membranes. Lungs: Clear and comfortable work of breathing, speaking in full sentences without  accessory muscle use. Cardiac: Regular rate and rhythm.  Neuro: CN II-XII grossly intact, gait normal. Extremities: No peripheral edema.  Strong peripheral pulses.  Mental Status: mild depression without anxiety, nor agitation. Logical though process. Skin: Warm and dry.  Assessment & Plan: [name] was seen today for hypertension.  Diagnoses and all orders for this visit:  Essential hypertension, benign Orders: -     lisinopril-hydrochlorothiazide (PRINZIDE,ZESTORETIC) 20-25 MG per tablet; Take 1 tablet by mouth daily.  Depression Orders: -     buPROPion (WELLBUTRIN XL) 150 MG 24 hr tablet; Take 1 tablet (150 mg total) by mouth daily. Take with 300mg  formulation. -     buPROPion (WELLBUTRIN XL) 300 MG 24 hr tablet; Take 1 tablet (300 mg total) by mouth daily.  ADD (attention deficit disorder) Orders: -     CONCERTA 18 MG CR tablet; Take 1 tablet (18 mg total) by mouth daily.  Asthma, mild persistent, uncomplicated Orders: -     budesonide-formoterol (SYMBICORT) 80-4.5 MCG/ACT inhaler; Inhale 2 puffs into the lungs 2 (two) times daily.   Essential hypertension: Uncontrolled restart lisinopril-hydrochlorothiazide follow-up 4 weeks Depression: Uncontrolled increasing Wellbutrin, if not improved by next visit will refer back to behavioral health ADD: Improved on stimulant however 5 as causing intolerable headache therefore switching to Concerta Asthma: Uncontrolled chronic condition she was given 3 samples of Symbicort to start immediately  Return in about 4 weeks (around 05/15/2014).

## 2014-04-18 ENCOUNTER — Telehealth: Payer: Self-pay | Admitting: *Deleted

## 2014-04-18 MED ORDER — DEXMETHYLPHENIDATE HCL ER 10 MG PO CP24: 10.0000 mg | ORAL_CAPSULE | Freq: Every day | ORAL | Status: DC

## 2014-04-18 NOTE — Telephone Encounter (Signed)
Called patient & told her new Rx is written gave her the name of new med & suggested she might call the pharmacy & check on pricing prior so she would know what to expect for co-pay.

## 2014-04-18 NOTE — Telephone Encounter (Signed)
Janet Harrison/[name], Rx placed in in-box ready for pickup, focalin xr 10mg  to replace concerta.

## 2014-05-07 ENCOUNTER — Telehealth: Payer: Self-pay | Admitting: *Deleted

## 2014-05-07 NOTE — Telephone Encounter (Signed)
That's exactly what I told him.

## 2014-05-07 NOTE — Telephone Encounter (Signed)
Told you so.

## 2014-05-07 NOTE — Telephone Encounter (Signed)
[facility] in [place] left vm Friday evening regarding the Concerta rx written on 5/94 and the Focalin rx written on 3/23.  Pt had filled & picked up the focalin but then brought the rx for concerta to the [facility] to be filled as well.  Pharmacist saw where the focalin had been filled so he kept the hard copy of concerta & called [place].  He said pt got a little agitated that he wouldn't fill it.  I told him to discard the concerta. Just wanted you to have an FYI.

## 2014-05-07 NOTE — Telephone Encounter (Signed)
Amber, Will you please let the pharmacist know that Focalin is what I intend her to have from now on, concerta Rx can be destroyed.

## 2014-05-15 ENCOUNTER — Other Ambulatory Visit: Payer: Self-pay

## 2014-05-18 ENCOUNTER — Other Ambulatory Visit: Payer: Self-pay | Admitting: *Deleted

## 2014-05-18 MED ORDER — DEXMETHYLPHENIDATE HCL ER 10 MG PO CP24: 10.0000 mg | ORAL_CAPSULE | Freq: Every day | ORAL | Status: DC

## 2014-05-21 ENCOUNTER — Ambulatory Visit: Payer: Self-pay | Admitting: Family Medicine

## 2014-06-15 ENCOUNTER — Other Ambulatory Visit: Payer: Self-pay | Admitting: *Deleted

## 2014-06-15 MED ORDER — DEXMETHYLPHENIDATE HCL ER 10 MG PO CP24: 10.0000 mg | ORAL_CAPSULE | Freq: Every day | ORAL | Status: DC

## 2014-07-19 ENCOUNTER — Other Ambulatory Visit: Payer: Self-pay | Admitting: *Deleted

## 2014-07-19 MED ORDER — DEXMETHYLPHENIDATE HCL ER 10 MG PO CP24: 10.0000 mg | ORAL_CAPSULE | Freq: Every day | ORAL | Status: DC

## 2014-08-16 ENCOUNTER — Telehealth: Payer: Self-pay | Admitting: *Deleted

## 2014-08-16 NOTE — Telephone Encounter (Signed)
I agree. She needs to schedule an appointment since this is a scheduled drug at the dosing is going to be increased or adjusted that is to be a documented office visit to do so. She is welcome to schedule with him sometime next week

## 2014-08-16 NOTE — Telephone Encounter (Signed)
Pt called and wanted to know if a rx could be written for her focalin to take twice a day. She states she is having a "very hard time at work." She is aware that Dr. Ileene Rubens will not be in the office until Monday. I advised pt that this is something that needs to be addressed in an office visit with [place]. She wanted me to forward this note to another provider here in this office to see if they would consider writing the focalin rx for twice a day. I advised that even if another provider agreed to increasing dose an office visit would still be necessary. Pt wanted me to still forward this phone note to another provider here in the office.

## 2014-08-17 ENCOUNTER — Telehealth: Payer: Self-pay | Admitting: *Deleted

## 2014-08-17 NOTE — Telephone Encounter (Signed)
Left message on vm

## 2014-08-17 NOTE — Telephone Encounter (Signed)
Pt called and asked if another provider could write a rx for Vyvanse the same rx that she had before. I called her and told her that she needs to address her concerns in an office visit

## 2014-08-21 ENCOUNTER — Encounter: Payer: Self-pay | Admitting: Family Medicine

## 2014-08-21 ENCOUNTER — Ambulatory Visit (INDEPENDENT_AMBULATORY_CARE_PROVIDER_SITE_OTHER): Payer: BLUE CROSS/BLUE SHIELD | Admitting: Family Medicine

## 2014-08-21 VITALS — BP 147/107 | HR 106 | Wt 152.0 lb

## 2014-08-21 DIAGNOSIS — I10 Essential (primary) hypertension: Secondary | ICD-10-CM | POA: Diagnosis not present

## 2014-08-21 DIAGNOSIS — F988 Other specified behavioral and emotional disorders with onset usually occurring in childhood and adolescence: Secondary | ICD-10-CM

## 2014-08-21 DIAGNOSIS — F909 Attention-deficit hyperactivity disorder, unspecified type: Secondary | ICD-10-CM

## 2014-08-21 MED ORDER — DEXMETHYLPHENIDATE HCL ER 25 MG PO CP24: 1.0000 | ORAL_CAPSULE | Freq: Every day | ORAL | Status: DC

## 2014-08-21 NOTE — Progress Notes (Signed)
CC: Janet Harrison is a 39 y.o. female is here for discuss ADHD   Subjective: HPI:  Follow-up ADD: she feels this is worsening. She feels like her ability to focus at work is severely compromised. She feels like she is constantly thinking about 1 million things at once both at work and at home. She tried to self medicate herself by taking an additional 10 mg of Focalin at night,this doesn't seem to help. She is having difficulty falling asleep and staying asleep. She feels fatigued in the morning. She feels somewhat anxious like she is losing control of his ability to concentrate. She also feels an element of depression but no thoughts of wanting to harm self or others.  Follow-up essential hypertension: continues withdaily lisinopril-hydrochlorothiazide with no new side effects. No outside blood pressures report. Denies chest pain shortness of breath orthopnea nor peripheral edema   Review Of Systems Outlined In HPI  Past Medical History  Diagnosis Date  . PONV (postoperative nausea and vomiting)   . Headache(784.0)     migraines  . Depression   . Asthma     prn inhalers  . ADD (attention deficit disorder)   . Breast lump 08/2012    right    Past Surgical History  Procedure Laterality Date  . Tonsillectomy and adenoidectomy      as a child  . Leep    . Tubal ligation  2003, 2005  . Cyst excision Right as a teenager    wrist  . Endometrial ablation    . Breast lumpectomy with needle localization Right 09/20/2012    Procedure: BREAST LUMPECTOMY WITH NEEDLE LOCALIZATION;  Surgeon: Marcello Moores A. Cornett, MD;  Location: [place];  Service: General;  Laterality: Right;   Family History  Problem Relation Age of Onset  . Lung cancer Maternal Aunt   . Breast cancer Maternal Aunt   . Breast cancer Maternal Grandmother   . Lung cancer Maternal Grandfather   . Breast cancer Maternal Aunt   . Ovarian cancer Maternal Aunt   . Ovarian cancer Maternal Aunt   . ADD / ADHD  Maternal Uncle     History   Social History  . Marital Status: Legally Separated    Spouse Name: N/A  . Number of Children: N/A  . Years of Education: N/A   Occupational History  . Not on file.   Social History Main Topics  . Smoking status: Current Every Day Smoker -- 23 years    Types: Cigarettes  . Smokeless tobacco: Never Used     Comment: 6-7 cig./day  . Alcohol Use: 1.2 oz/week    2 Glasses of wine per week     Comment: 2 glasses wine/week  . Drug Use: No  . Sexual Activity: Not Currently   Other Topics Concern  . Not on file   Social History Narrative     Objective: BP 147/107 mmHg  Pulse 106  Wt 152 lb (68.947 kg)  General: Alert and Oriented, No Acute Distress HEENT: Pupils equal, round, reactive to light. Conjunctivae clear. Moist mucous Lungs: Clear to auscultation bilaterally, no wheezing/ronchi/rales.  Comfortable work of breathing. Good air movement. Cardiac: Regular rate and rhythm. Normal S1/S2.  No murmurs, rubs, nor gallops.   Abdomen: Normal bowel sounds, soft and non tender without palpable masses. Extremities: No peripheral edema.  Strong peripheral pulses.  Mental Status:mildly depressed mild anxiety. No agitation. Logical thought process Skin: Warm and dry.  Assessment & Plan: Janet Harrison was seen today for  discuss adhd.  Diagnoses and all orders for this visit:  ADD (attention deficit disorder) Orders: -     Dexmethylphenidate HCl (FOCALIN XR) 25 MG CP24; Take 1 tablet by mouth daily.  Essential hypertension, benign   ADD: Uncontrolled increasing Focalin, urged to take this only once a day. If no better in one week please call me and we'll adjust depression medication. Essential hypertension: Uncontrolled chronic condition, she is somewhat resistant to increase blood pressure medication thinking that her blood pressure is much lower outside of our office. In order to test this I've advised her to provide me with a week's worth of blood  pressures taken at random times throughout the day outside of our office. If blood pressure remains above 140/90 I have advised her that she'll need to increase her dose of blood pressure medication  Return in about 3 months (around 11/21/2014).

## 2014-08-27 ENCOUNTER — Telehealth: Payer: Self-pay | Admitting: *Deleted

## 2014-08-27 NOTE — Telephone Encounter (Signed)
Seth Bake, Can you ask her to call her insurance company to see what ADD medications are preferred based on her insurance plan.  There's probably one or two that have a much lower co-pay, if she can provide me with this I can give her my recommendation based on what's available.

## 2014-08-27 NOTE — Telephone Encounter (Signed)
Pt notified and she will get back with [place]

## 2014-08-27 NOTE — Telephone Encounter (Signed)
Pt called and states focalin does not seem to be working for her. She also states the copay was $100. Please advise

## 2014-08-28 MED ORDER — LISDEXAMFETAMINE DIMESYLATE 40 MG PO CAPS: 40.0000 mg | ORAL_CAPSULE | ORAL | Status: DC

## 2014-08-28 NOTE — Telephone Encounter (Signed)
Let's try vyvanse which was tolerated last year.

## 2014-08-28 NOTE — Addendum Note (Signed)
Addended by: Marcial Pacas on: 08/28/2014 02:09 PM   Modules accepted: Orders

## 2014-08-28 NOTE — Telephone Encounter (Signed)
Pt states under her insurance focalin 10 mg is $40, adderall is covered and vyvanse

## 2014-08-28 NOTE — Telephone Encounter (Signed)
Pt notified and rx up at the front

## 2014-09-28 ENCOUNTER — Ambulatory Visit: Payer: Self-pay | Admitting: Family Medicine

## 2014-10-02 ENCOUNTER — Encounter: Payer: Self-pay | Admitting: Family Medicine

## 2014-10-02 ENCOUNTER — Ambulatory Visit (INDEPENDENT_AMBULATORY_CARE_PROVIDER_SITE_OTHER): Payer: BLUE CROSS/BLUE SHIELD | Admitting: Family Medicine

## 2014-10-02 VITALS — BP 156/99 | HR 87 | Wt 152.0 lb

## 2014-10-02 DIAGNOSIS — F909 Attention-deficit hyperactivity disorder, unspecified type: Secondary | ICD-10-CM

## 2014-10-02 DIAGNOSIS — F32A Depression, unspecified: Secondary | ICD-10-CM

## 2014-10-02 DIAGNOSIS — F988 Other specified behavioral and emotional disorders with onset usually occurring in childhood and adolescence: Secondary | ICD-10-CM

## 2014-10-02 DIAGNOSIS — F329 Major depressive disorder, single episode, unspecified: Secondary | ICD-10-CM | POA: Diagnosis not present

## 2014-10-02 MED ORDER — VILAZODONE HCL 10 & 20 MG PO KIT: 1.0000 | PACK | Freq: Every day | ORAL | Status: AC

## 2014-10-02 MED ORDER — LISDEXAMFETAMINE DIMESYLATE 40 MG PO CAPS: 40.0000 mg | ORAL_CAPSULE | ORAL | Status: DC

## 2014-10-02 NOTE — Progress Notes (Signed)
CC: Janet Harrison is a 39 y.o. female is here for discuss mood   Subjective: HPI:  Follow-up depression: Taking Wellbutrin and Lexapro as prescribed. She tells me that every day of the week even on the weekends she feels depressed to the point where she doesn't want to get out of bed. She's lost interest in all of her former hobbies and interests. She denies thoughts of going to harm herself or others but states her subjective depression is just stuck and not getting any better. She also reports a mild degree of anxiety. No other mental disturbance. No fevers, chills, unintentional weight loss, appetite changes nor hallucinations.   Requesting a refill on Vyvanse. She tells me this is helping to moderate degree with distractibility and keeping on track at work. No known side effects.   Review Of Systems Outlined In HPI  Past Medical History  Diagnosis Date  . PONV (postoperative nausea and vomiting)   . Headache(784.0)     migraines  . Depression   . Asthma     prn inhalers  . ADD (attention deficit disorder)   . Breast lump 08/2012    right    Past Surgical History  Procedure Laterality Date  . Tonsillectomy and adenoidectomy      as a child  . Leep    . Tubal ligation  2003, 2005  . Cyst excision Right as a teenager    wrist  . Endometrial ablation    . Breast lumpectomy with needle localization Right 09/20/2012    Procedure: BREAST LUMPECTOMY WITH NEEDLE LOCALIZATION;  Surgeon: Marcello Moores A. Cornett, MD;  Location: [place];  Service: General;  Laterality: Right;   Family History  Problem Relation Age of Onset  . Lung cancer Maternal Aunt   . Breast cancer Maternal Aunt   . Breast cancer Maternal Grandmother   . Lung cancer Maternal Grandfather   . Breast cancer Maternal Aunt   . Ovarian cancer Maternal Aunt   . Ovarian cancer Maternal Aunt   . ADD / ADHD Maternal Uncle     Social History   Social History  . Marital Status: Legally Separated    Spouse  Name: N/A  . Number of Children: N/A  . Years of Education: N/A   Occupational History  . Not on file.   Social History Main Topics  . Smoking status: Current Every Day Smoker -- 23 years    Types: Cigarettes  . Smokeless tobacco: Never Used     Comment: 6-7 cig./day  . Alcohol Use: 1.2 oz/week    2 Glasses of wine per week     Comment: 2 glasses wine/week  . Drug Use: No  . Sexual Activity: Not Currently   Other Topics Concern  . Not on file   Social History Narrative     Objective: BP 156/99 mmHg  Pulse 87  Wt 152 lb (68.947 kg)  Vital signs reviewed. General: Alert and Oriented, No Acute Distress HEENT: Pupils equal, round, reactive to light. Conjunctivae clear.  External ears unremarkable.  Moist mucous membranes. Lungs: Clear and comfortable work of breathing, speaking in full sentences without accessory muscle use. Cardiac: Regular rate and rhythm.  Neuro: CN II-XII grossly intact, gait normal. Extremities: No peripheral edema.  Strong peripheral pulses.  Mental Status: Mild depression. No anxiety, nor agitation. Logical though process. Skin: Warm and dry.  Assessment & Plan: Janet Harrison was seen today for discuss mood.  Diagnoses and all orders for this visit:  Depression  Other orders -     Vilazodone HCl (VIIBRYD STARTER PACK) 10 & 20 MG KIT; Take 1 tablet by mouth daily. -     lisdexamfetamine (VYVANSE) 40 MG capsule; Take 1 capsule (40 mg total) by mouth every morning.   Depression: Uncontrolled chronic condition tapering down on Wellbutrin to begin at 300 mg daily for 3 days. She will then transition to 150 mg daily. After 3 days of this she'll stop and start Viibryd. She'll continue on Lexapro. Follow-up in 2-3 weeks. ADD: Control continue Vyvanse  Follow-up in 2-3 weeks for symptom assessment and also rechecking blood pressure off of Wellbutrin.  Return in about 3 weeks (around 10/23/2014).

## 2014-11-01 ENCOUNTER — Other Ambulatory Visit: Payer: Self-pay | Admitting: *Deleted

## 2014-11-01 MED ORDER — LISDEXAMFETAMINE DIMESYLATE 40 MG PO CAPS: 40.0000 mg | ORAL_CAPSULE | ORAL | Status: AC

## 2017-12-29 DIAGNOSIS — G43909 Migraine, unspecified, not intractable, without status migrainosus: Secondary | ICD-10-CM | POA: Diagnosis not present

## 2017-12-29 DIAGNOSIS — F4323 Adjustment disorder with mixed anxiety and depressed mood: Secondary | ICD-10-CM | POA: Diagnosis not present

## 2017-12-29 DIAGNOSIS — Z1331 Encounter for screening for depression: Secondary | ICD-10-CM | POA: Diagnosis not present

## 2017-12-29 DIAGNOSIS — Z23 Encounter for immunization: Secondary | ICD-10-CM | POA: Diagnosis not present

## 2017-12-29 DIAGNOSIS — F900 Attention-deficit hyperactivity disorder, predominantly inattentive type: Secondary | ICD-10-CM | POA: Diagnosis not present

## 2018-02-01 DIAGNOSIS — Z6829 Body mass index (BMI) 29.0-29.9, adult: Secondary | ICD-10-CM | POA: Diagnosis not present

## 2018-02-01 DIAGNOSIS — G43909 Migraine, unspecified, not intractable, without status migrainosus: Secondary | ICD-10-CM | POA: Diagnosis not present

## 2018-02-01 DIAGNOSIS — F4323 Adjustment disorder with mixed anxiety and depressed mood: Secondary | ICD-10-CM | POA: Diagnosis not present

## 2018-02-01 DIAGNOSIS — F900 Attention-deficit hyperactivity disorder, predominantly inattentive type: Secondary | ICD-10-CM | POA: Diagnosis not present

## 2018-04-11 DIAGNOSIS — F4323 Adjustment disorder with mixed anxiety and depressed mood: Secondary | ICD-10-CM | POA: Diagnosis not present

## 2018-04-11 DIAGNOSIS — G43909 Migraine, unspecified, not intractable, without status migrainosus: Secondary | ICD-10-CM | POA: Diagnosis not present

## 2018-04-11 DIAGNOSIS — F900 Attention-deficit hyperactivity disorder, predominantly inattentive type: Secondary | ICD-10-CM | POA: Diagnosis not present

## 2018-04-11 DIAGNOSIS — Z6827 Body mass index (BMI) 27.0-27.9, adult: Secondary | ICD-10-CM | POA: Diagnosis not present

## 2018-06-14 DIAGNOSIS — Z6826 Body mass index (BMI) 26.0-26.9, adult: Secondary | ICD-10-CM | POA: Diagnosis not present

## 2018-06-14 DIAGNOSIS — G43909 Migraine, unspecified, not intractable, without status migrainosus: Secondary | ICD-10-CM | POA: Diagnosis not present

## 2018-06-14 DIAGNOSIS — F4323 Adjustment disorder with mixed anxiety and depressed mood: Secondary | ICD-10-CM | POA: Diagnosis not present

## 2018-06-14 DIAGNOSIS — F900 Attention-deficit hyperactivity disorder, predominantly inattentive type: Secondary | ICD-10-CM | POA: Diagnosis not present

## 2018-08-15 DIAGNOSIS — F900 Attention-deficit hyperactivity disorder, predominantly inattentive type: Secondary | ICD-10-CM | POA: Diagnosis not present

## 2018-08-15 DIAGNOSIS — F4323 Adjustment disorder with mixed anxiety and depressed mood: Secondary | ICD-10-CM | POA: Diagnosis not present

## 2018-08-15 DIAGNOSIS — Z6825 Body mass index (BMI) 25.0-25.9, adult: Secondary | ICD-10-CM | POA: Diagnosis not present

## 2018-08-15 DIAGNOSIS — G43909 Migraine, unspecified, not intractable, without status migrainosus: Secondary | ICD-10-CM | POA: Diagnosis not present

## 2018-10-18 DIAGNOSIS — F900 Attention-deficit hyperactivity disorder, predominantly inattentive type: Secondary | ICD-10-CM | POA: Diagnosis not present

## 2018-10-18 DIAGNOSIS — J45909 Unspecified asthma, uncomplicated: Secondary | ICD-10-CM | POA: Diagnosis not present

## 2018-10-18 DIAGNOSIS — Z6824 Body mass index (BMI) 24.0-24.9, adult: Secondary | ICD-10-CM | POA: Diagnosis not present

## 2018-10-18 DIAGNOSIS — F4323 Adjustment disorder with mixed anxiety and depressed mood: Secondary | ICD-10-CM | POA: Diagnosis not present

## 2018-12-19 DIAGNOSIS — Z6826 Body mass index (BMI) 26.0-26.9, adult: Secondary | ICD-10-CM | POA: Diagnosis not present

## 2018-12-19 DIAGNOSIS — F4323 Adjustment disorder with mixed anxiety and depressed mood: Secondary | ICD-10-CM | POA: Diagnosis not present

## 2018-12-19 DIAGNOSIS — F900 Attention-deficit hyperactivity disorder, predominantly inattentive type: Secondary | ICD-10-CM | POA: Diagnosis not present

## 2019-01-10 DIAGNOSIS — Z20828 Contact with and (suspected) exposure to other viral communicable diseases: Secondary | ICD-10-CM | POA: Diagnosis not present

## 2019-01-10 DIAGNOSIS — Z6826 Body mass index (BMI) 26.0-26.9, adult: Secondary | ICD-10-CM | POA: Diagnosis not present

## 2019-01-10 DIAGNOSIS — E663 Overweight: Secondary | ICD-10-CM | POA: Diagnosis not present

## 2019-02-16 DIAGNOSIS — Z6826 Body mass index (BMI) 26.0-26.9, adult: Secondary | ICD-10-CM | POA: Diagnosis not present

## 2019-02-16 DIAGNOSIS — F900 Attention-deficit hyperactivity disorder, predominantly inattentive type: Secondary | ICD-10-CM | POA: Diagnosis not present

## 2019-02-16 DIAGNOSIS — F4323 Adjustment disorder with mixed anxiety and depressed mood: Secondary | ICD-10-CM | POA: Diagnosis not present

## 2019-04-10 DIAGNOSIS — G4700 Insomnia, unspecified: Secondary | ICD-10-CM | POA: Diagnosis not present

## 2019-04-10 DIAGNOSIS — F4323 Adjustment disorder with mixed anxiety and depressed mood: Secondary | ICD-10-CM | POA: Diagnosis not present

## 2019-04-10 DIAGNOSIS — F900 Attention-deficit hyperactivity disorder, predominantly inattentive type: Secondary | ICD-10-CM | POA: Diagnosis not present

## 2019-04-16 DIAGNOSIS — Z20828 Contact with and (suspected) exposure to other viral communicable diseases: Secondary | ICD-10-CM | POA: Diagnosis not present

## 2019-04-16 DIAGNOSIS — Z03818 Encounter for observation for suspected exposure to other biological agents ruled out: Secondary | ICD-10-CM | POA: Diagnosis not present

## 2019-06-01 DIAGNOSIS — F4323 Adjustment disorder with mixed anxiety and depressed mood: Secondary | ICD-10-CM | POA: Diagnosis not present

## 2019-06-01 DIAGNOSIS — F900 Attention-deficit hyperactivity disorder, predominantly inattentive type: Secondary | ICD-10-CM | POA: Diagnosis not present

## 2019-06-01 DIAGNOSIS — J45909 Unspecified asthma, uncomplicated: Secondary | ICD-10-CM | POA: Diagnosis not present

## 2019-06-01 DIAGNOSIS — G4700 Insomnia, unspecified: Secondary | ICD-10-CM | POA: Diagnosis not present

## 2019-06-12 DIAGNOSIS — Z6824 Body mass index (BMI) 24.0-24.9, adult: Secondary | ICD-10-CM | POA: Diagnosis not present

## 2019-06-12 DIAGNOSIS — Z01419 Encounter for gynecological examination (general) (routine) without abnormal findings: Secondary | ICD-10-CM | POA: Diagnosis not present

## 2019-06-12 DIAGNOSIS — Z0000 Encounter for general adult medical examination without abnormal findings: Secondary | ICD-10-CM | POA: Diagnosis not present

## 2019-06-12 DIAGNOSIS — E559 Vitamin D deficiency, unspecified: Secondary | ICD-10-CM | POA: Diagnosis not present

## 2019-06-12 DIAGNOSIS — Z13220 Encounter for screening for lipoid disorders: Secondary | ICD-10-CM | POA: Diagnosis not present

## 2019-06-12 DIAGNOSIS — Z113 Encounter for screening for infections with a predominantly sexual mode of transmission: Secondary | ICD-10-CM | POA: Diagnosis not present

## 2019-06-12 DIAGNOSIS — Z1151 Encounter for screening for human papillomavirus (HPV): Secondary | ICD-10-CM | POA: Diagnosis not present

## 2019-07-10 DIAGNOSIS — R030 Elevated blood-pressure reading, without diagnosis of hypertension: Secondary | ICD-10-CM | POA: Diagnosis not present

## 2019-07-10 DIAGNOSIS — F4323 Adjustment disorder with mixed anxiety and depressed mood: Secondary | ICD-10-CM | POA: Diagnosis not present

## 2019-07-10 DIAGNOSIS — G4700 Insomnia, unspecified: Secondary | ICD-10-CM | POA: Diagnosis not present

## 2019-07-10 DIAGNOSIS — F900 Attention-deficit hyperactivity disorder, predominantly inattentive type: Secondary | ICD-10-CM | POA: Diagnosis not present
